# Patient Record
Sex: Female | Born: 1960
Health system: Southern US, Community
[De-identification: ages and names within clinical notes are randomized; demographics above are authoritative.]

## PROBLEM LIST (undated history)

## (undated) DIAGNOSIS — N3010 Interstitial cystitis (chronic) without hematuria: Secondary | ICD-10-CM

## (undated) DIAGNOSIS — B029 Zoster without complications: Secondary | ICD-10-CM

## (undated) DIAGNOSIS — I10 Essential (primary) hypertension: Secondary | ICD-10-CM

## (undated) DIAGNOSIS — F419 Anxiety disorder, unspecified: Secondary | ICD-10-CM

## (undated) DIAGNOSIS — M1990 Unspecified osteoarthritis, unspecified site: Secondary | ICD-10-CM

## (undated) DIAGNOSIS — K219 Gastro-esophageal reflux disease without esophagitis: Secondary | ICD-10-CM

## (undated) HISTORY — PX: TONSILLECTOMY: SUR1361

## (undated) HISTORY — DX: Unspecified osteoarthritis, unspecified site: M19.90

## (undated) HISTORY — DX: Zoster without complications: B02.9

## (undated) HISTORY — PX: HYSTEROSCOPY: SHX211

## (undated) HISTORY — DX: Gastro-esophageal reflux disease without esophagitis: K21.9

## (undated) HISTORY — DX: Essential (primary) hypertension: I10

## (undated) HISTORY — DX: Anxiety disorder, unspecified: F41.9

## (undated) HISTORY — DX: Interstitial cystitis (chronic) without hematuria: N30.10

## (undated) HISTORY — PX: ABDOMINAL HYSTERECTOMY: SUR658

---

## 1995-07-13 ENCOUNTER — Encounter: Payer: Self-pay | Admitting: Gastroenterology

## 1997-12-12 ENCOUNTER — Other Ambulatory Visit: Admission: RE | Admit: 1997-12-12 | Discharge: 1997-12-12 | Payer: Self-pay | Admitting: Obstetrics and Gynecology

## 1999-01-24 ENCOUNTER — Other Ambulatory Visit: Admission: RE | Admit: 1999-01-24 | Discharge: 1999-01-24 | Payer: Self-pay | Admitting: Obstetrics and Gynecology

## 2000-05-12 ENCOUNTER — Other Ambulatory Visit: Admission: RE | Admit: 2000-05-12 | Discharge: 2000-05-12 | Payer: Self-pay | Admitting: Obstetrics and Gynecology

## 2001-06-27 ENCOUNTER — Other Ambulatory Visit: Admission: RE | Admit: 2001-06-27 | Discharge: 2001-06-27 | Payer: Self-pay | Admitting: Obstetrics and Gynecology

## 2001-10-03 ENCOUNTER — Encounter (INDEPENDENT_AMBULATORY_CARE_PROVIDER_SITE_OTHER): Payer: Self-pay

## 2001-10-03 ENCOUNTER — Ambulatory Visit (HOSPITAL_COMMUNITY): Admission: RE | Admit: 2001-10-03 | Discharge: 2001-10-03 | Payer: Self-pay | Admitting: Obstetrics and Gynecology

## 2002-08-08 ENCOUNTER — Other Ambulatory Visit: Admission: RE | Admit: 2002-08-08 | Discharge: 2002-08-08 | Payer: Self-pay | Admitting: Obstetrics and Gynecology

## 2003-09-19 ENCOUNTER — Other Ambulatory Visit: Admission: RE | Admit: 2003-09-19 | Discharge: 2003-09-19 | Payer: Self-pay | Admitting: Obstetrics and Gynecology

## 2004-11-13 ENCOUNTER — Other Ambulatory Visit: Admission: RE | Admit: 2004-11-13 | Discharge: 2004-11-13 | Payer: Self-pay | Admitting: Obstetrics and Gynecology

## 2005-01-07 ENCOUNTER — Encounter (INDEPENDENT_AMBULATORY_CARE_PROVIDER_SITE_OTHER): Payer: Self-pay | Admitting: Specialist

## 2005-01-07 ENCOUNTER — Ambulatory Visit (HOSPITAL_BASED_OUTPATIENT_CLINIC_OR_DEPARTMENT_OTHER): Admission: RE | Admit: 2005-01-07 | Discharge: 2005-01-07 | Payer: Self-pay | Admitting: Urology

## 2005-01-07 ENCOUNTER — Ambulatory Visit (HOSPITAL_COMMUNITY): Admission: RE | Admit: 2005-01-07 | Discharge: 2005-01-07 | Payer: Self-pay | Admitting: Urology

## 2006-12-16 ENCOUNTER — Ambulatory Visit (HOSPITAL_COMMUNITY): Admission: RE | Admit: 2006-12-16 | Discharge: 2006-12-17 | Payer: Self-pay | Admitting: Obstetrics and Gynecology

## 2006-12-16 ENCOUNTER — Encounter (INDEPENDENT_AMBULATORY_CARE_PROVIDER_SITE_OTHER): Payer: Self-pay | Admitting: Obstetrics and Gynecology

## 2007-04-21 HISTORY — PX: UPPER GASTROINTESTINAL ENDOSCOPY: SHX188

## 2007-04-21 HISTORY — PX: COLONOSCOPY: SHX174

## 2007-11-17 ENCOUNTER — Telehealth: Payer: Self-pay | Admitting: Internal Medicine

## 2007-12-19 DIAGNOSIS — K219 Gastro-esophageal reflux disease without esophagitis: Secondary | ICD-10-CM | POA: Insufficient documentation

## 2007-12-19 DIAGNOSIS — J069 Acute upper respiratory infection, unspecified: Secondary | ICD-10-CM | POA: Insufficient documentation

## 2007-12-19 DIAGNOSIS — K209 Esophagitis, unspecified without bleeding: Secondary | ICD-10-CM | POA: Insufficient documentation

## 2007-12-20 ENCOUNTER — Ambulatory Visit: Payer: Self-pay | Admitting: Gastroenterology

## 2007-12-20 DIAGNOSIS — N3010 Interstitial cystitis (chronic) without hematuria: Secondary | ICD-10-CM | POA: Insufficient documentation

## 2007-12-20 DIAGNOSIS — F411 Generalized anxiety disorder: Secondary | ICD-10-CM | POA: Insufficient documentation

## 2007-12-20 DIAGNOSIS — K589 Irritable bowel syndrome without diarrhea: Secondary | ICD-10-CM | POA: Insufficient documentation

## 2007-12-20 DIAGNOSIS — R519 Headache, unspecified: Secondary | ICD-10-CM | POA: Insufficient documentation

## 2007-12-20 DIAGNOSIS — D649 Anemia, unspecified: Secondary | ICD-10-CM | POA: Insufficient documentation

## 2007-12-20 DIAGNOSIS — R51 Headache: Secondary | ICD-10-CM | POA: Insufficient documentation

## 2007-12-20 DIAGNOSIS — K5900 Constipation, unspecified: Secondary | ICD-10-CM | POA: Insufficient documentation

## 2007-12-20 LAB — CONVERTED CEMR LAB
AST: 19 units/L (ref 0–37)
Albumin: 4.2 g/dL (ref 3.5–5.2)
Alkaline Phosphatase: 66 units/L (ref 39–117)
Basophils Absolute: 0 10*3/uL (ref 0.0–0.1)
Bilirubin, Direct: 0.1 mg/dL (ref 0.0–0.3)
CO2: 30 meq/L (ref 19–32)
Calcium: 8.9 mg/dL (ref 8.4–10.5)
Chloride: 110 meq/L (ref 96–112)
Glucose, Bld: 94 mg/dL (ref 70–99)
Hemoglobin: 13 g/dL (ref 12.0–15.0)
Lymphocytes Relative: 37.2 % (ref 12.0–46.0)
MCHC: 34 g/dL (ref 30.0–36.0)
Monocytes Relative: 8.8 % (ref 3.0–12.0)
Neutro Abs: 2.7 10*3/uL (ref 1.4–7.7)
Neutrophils Relative %: 52.5 % (ref 43.0–77.0)
Platelets: 213 10*3/uL (ref 150–400)
Potassium: 4 meq/L (ref 3.5–5.1)
RDW: 12 % (ref 11.5–14.6)
Sodium: 143 meq/L (ref 135–145)
Total Bilirubin: 0.7 mg/dL (ref 0.3–1.2)
Total Protein: 7.5 g/dL (ref 6.0–8.3)

## 2008-01-02 ENCOUNTER — Encounter: Payer: Self-pay | Admitting: Gastroenterology

## 2008-01-02 ENCOUNTER — Ambulatory Visit: Payer: Self-pay | Admitting: Gastroenterology

## 2008-01-05 ENCOUNTER — Encounter: Payer: Self-pay | Admitting: Gastroenterology

## 2009-05-21 ENCOUNTER — Ambulatory Visit: Payer: Self-pay | Admitting: Gastroenterology

## 2009-05-21 DIAGNOSIS — R1013 Epigastric pain: Secondary | ICD-10-CM | POA: Insufficient documentation

## 2009-05-23 ENCOUNTER — Ambulatory Visit (HOSPITAL_COMMUNITY): Admission: RE | Admit: 2009-05-23 | Discharge: 2009-05-23 | Payer: Self-pay | Admitting: Gastroenterology

## 2009-05-23 LAB — CONVERTED CEMR LAB
ALT: 15 units/L (ref 0–35)
AST: 20 units/L (ref 0–37)
CO2: 30 meq/L (ref 19–32)
Creatinine, Ser: 0.6 mg/dL (ref 0.4–1.2)
Ferritin: 34.1 ng/mL (ref 10.0–291.0)
Folate: 11.6 ng/mL
GFR calc non Af Amer: 113.92 mL/min (ref >60)
Glucose, Bld: 87 mg/dL (ref 70–99)
HCT: 39.1 % (ref 36.0–46.0)
Iron: 81 ug/dL (ref 42–145)
Lymphocytes Relative: 36.8 % (ref 12.0–46.0)
Lymphs Abs: 1.6 10*3/uL (ref 0.7–4.0)
MCHC: 33.2 g/dL (ref 30.0–36.0)
MCV: 91.9 fL (ref 78.0–100.0)
Monocytes Absolute: 0.4 10*3/uL (ref 0.1–1.0)
Platelets: 189 10*3/uL (ref 150.0–400.0)
Potassium: 4.2 meq/L (ref 3.5–5.1)
RDW: 11.8 % (ref 11.5–14.6)
Sed Rate: 10 mm/hr (ref 0–22)
Sodium: 141 meq/L (ref 135–145)
TSH: 0.79 microintl units/mL (ref 0.35–5.50)
Total Protein: 7.5 g/dL (ref 6.0–8.3)
Vitamin B-12: 290 pg/mL (ref 211–911)
WBC: 4.3 10*3/uL — ABNORMAL LOW (ref 4.5–10.5)

## 2009-05-24 ENCOUNTER — Telehealth: Payer: Self-pay | Admitting: Gastroenterology

## 2010-03-04 ENCOUNTER — Encounter (INDEPENDENT_AMBULATORY_CARE_PROVIDER_SITE_OTHER): Payer: Self-pay | Admitting: *Deleted

## 2010-03-04 ENCOUNTER — Ambulatory Visit: Payer: Self-pay | Admitting: Gastroenterology

## 2010-03-04 DIAGNOSIS — R079 Chest pain, unspecified: Secondary | ICD-10-CM | POA: Insufficient documentation

## 2010-04-28 ENCOUNTER — Encounter: Payer: Self-pay | Admitting: Gastroenterology

## 2010-04-28 ENCOUNTER — Ambulatory Visit
Admission: RE | Admit: 2010-04-28 | Discharge: 2010-04-28 | Payer: Self-pay | Source: Home / Self Care | Attending: Gastroenterology | Admitting: Gastroenterology

## 2010-05-05 ENCOUNTER — Encounter: Payer: Self-pay | Admitting: Gastroenterology

## 2010-05-20 NOTE — Assessment & Plan Note (Signed)
Summary: HEARTBURN/YF   History of Present Illness Visit Type: Follow-up Visit Primary GI MD: Sheryn Bison MD FACP FAGA Primary Provider: Gweneth Dimitri, MD Requesting Provider: n/a Chief Complaint: Heartburn, discomfort in esophagus and generilized abdominal pain, has more of an issue after eating chocolate History of Present Illness:   extremity pleasant 50 year old Caucasian female who we have treated for acid reflux for several years. She currently is on omeprazole 20 mg b.i.d. For the last several much she's had increased coughing, choking, and also a chronic globus type sensation in her retropharyngeal area with some painful swallowing. We reviewed her endoscopy from 2 years ago which otherwise was noncontributory. She does not abuse alcohol, cigarettes, or NSAIDs. Her appetite is good her weight is stable. She has not been on frequent antibiotics, she does have a history of allergies and hayfever. Because of her complaints she currently is on Zyrtec.  She has had recent upper abdominal ultrasound exam by primary care, this was reviewed and appears unremarkable.   GI Review of Systems    Reports abdominal pain and  heartburn.     Location of  Abdominal pain: generalized.    Denies acid reflux, belching, bloating, chest pain, dysphagia with liquids, dysphagia with solids, loss of appetite, nausea, vomiting, vomiting blood, weight loss, and  weight gain.        Denies anal fissure, black tarry stools, change in bowel habit, constipation, diarrhea, diverticulosis, fecal incontinence, heme positive stool, hemorrhoids, irritable bowel syndrome, jaundice, light color stool, liver problems, rectal bleeding, and  rectal pain.    Current Medications (verified): 1)  Vivelle-Dot 0.075 Mg/24hr Pttw (Estradiol) .... As Directed 2)  Citalopram Hydrobromide 20 Mg Tabs (Citalopram Hydrobromide) .... Take 1 Tablet By Mouth Once Daily 3)  Prilosec 20 Mg Cpdr (Omeprazole) .Marland Kitchen.. 1 By Mouth Two Times A  Day  Allergies (verified): 1)  ! Septra 2)  ! Erythromycin 3)  ! * Nitrous Oxide  Past History:  Family History: Last updated: 05/21/2009 Family History of Prostate Cancer:Father Leukemia: MGM ETOH abuse: grandfather Family History of Breast Cancer:Aunts Family History of Crohn's: Mother Family History of Irritable Bowel Syndrome:Mother   No FH of Colon Cancer:  Social History: Last updated: 05/21/2009 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 Patient is a former smoker. -stopped at age 43 Daily Caffeine Use -1/2 cup daily  Past medical, surgical, family and social histories (including risk factors) reviewed for relevance to current acute and chronic problems.  Past Medical History: Reviewed history from 12/20/2007 and no changes required. Current Problems:  URI (ICD-465.9) GERD (ICD-530.81) ESOPHAGITIS (ICD-530.10) Current Problems:  IRRITABLE BOWEL SYNDROME (ICD-564.1) INTERSTITIAL CYSTITIS (ICD-595.1) HEADACHE, CHRONIC (ICD-784.0) ANXIETY DISORDER (ICD-300.00) ANEMIA (ICD-285.9) URI (ICD-465.9) GERD (ICD-530.81) ESOPHAGITIS (ICD-530.10)  Past Surgical History: Reviewed history from 05/21/2009 and no changes required. Tonsillectomy Hysterectomy And bilateral oophorectomy for endometriosis. The surgery is performed by Dr. Arelia Sneddon proximally year ago.  Family History: Reviewed history from 05/21/2009 and no changes required. Family History of Prostate Cancer:Father Leukemia: MGM ETOH abuse: grandfather Family History of Breast Cancer:Aunts Family History of Crohn's: Mother Family History of Irritable Bowel Syndrome:Mother   No FH of Colon Cancer:  Social History: Reviewed history from 05/21/2009 and no changes required. 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 Patient is a former smoker. -stopped at age 8 Daily Caffeine Use -1/2 cup  daily  Review of Systems  The patient complains of allergy/sinus and cough.  The patient denies anemia, anxiety-new, arthritis/joint pain, back pain, blood in urine, breast changes/lumps, change in vision, confusion, coughing up blood, depression-new, fainting, fatigue, fever, headaches-new, hearing problems, heart murmur, heart rhythm changes, itching, menstrual pain, muscle pains/cramps, night sweats, nosebleeds, pregnancy symptoms, shortness of breath, skin rash, sleeping problems, sore throat, swelling of feet/legs, swollen lymph glands, thirst - excessive , urination - excessive , urination changes/pain, urine leakage, vision changes, and voice change.   ENT:  Complains of nasal congestion, hoarseness, and difficulty swallowing; denies earache, ear discharge, tinnitus, decreased hearing, loss of smell, nosebleeds, and sore throat. Resp:  Complains of cough; denies dyspnea at rest, dyspnea with exercise, sputum, wheezing, coughing up blood, and pleurisy. GI:  Complains of indigestion/heartburn; denies difficulty swallowing, pain on swallowing, nausea, vomiting, vomiting blood, abdominal pain, jaundice, gas/bloating, diarrhea, constipation, change in bowel habits, bloody BM's, black BMs, and fecal incontinence.  Vital Signs:  Patient profile:   50 year old female Height:      67 inches Weight:      140 pounds BMI:     22.01 BSA:     1.74 Pulse rate:   80 / minute Pulse rhythm:   regular BP sitting:   122 / 78  (left arm)  Vitals Entered By: Merri Ray CMA Duncan Dull) (March 04, 2010 9:53 AM)  Physical Exam  General:  Well developed, well nourished, no acute distress.healthy appearing.   Head:  Normocephalic and atraumatic. Eyes:  PERRLA, no icterus.exam deferred to patient's ophthalmologist.   Mouth:  No deformity or lesions, dentition normal. Neck:  Supple; no masses or thyromegaly. Lungs:  Clear throughout to auscultation. Heart:  Regular rate and rhythm; no murmurs, rubs,   or bruits. Abdomen:  Soft, nontender and nondistended. No masses, hepatosplenomegaly or hernias noted. Normal bowel sounds. Msk:  Symmetrical with no gross deformities. Normal posture. Extremities:  No clubbing, cyanosis, edema or deformities noted. Neurologic:  Alert and  oriented x4;  grossly normal neurologically. Cervical Nodes:  No significant cervical adenopathy. Psych:  Alert and cooperative. Normal mood and affect.   Impression & Recommendations:  Problem # 1:  CHEST PAIN (ICD-786.50) Assessment Deteriorated Previous endoscopy did not show a large hiatal hernia or any significant esophagitis. Her current symptoms sound like she may have an" inlet pouch" in her upper esophagus versus eosinophilic esophagitis. I will repeat her endoscopy with esophageal biopsies and consider them. Dilation. Until her endoscopy had been completed we will continue her b.i.d. omeprazole with p.r.n. Carafate suspension. Orders: EGD (EGD)  Problem # 2:  IRRITABLE BOWEL SYNDROME (ICD-564.1) Assessment: Improved high-fiber diet as tolerated.  Problem # 3:  INTERSTITIAL CYSTITIS (ICD-595.1) Assessment: Comment Only  Problem # 4:  ANXIETY DISORDER (ICD-300.00) Assessment: Improved she is on Citalopram 20 mg a day her primary care.  Patient Instructions: 1)  Copy sent to : Gweneth Dimitri, MD 2)  Your prescription(s) have been sent to you pharmacy.  3)  Your procedure has been scheduled for 03/31/2010, please follow the seperate instructions.  4)  [facility] Patient Information Guide given to patient.  5)  Upper Endoscopy brochure given.  6)  The medication list was reviewed and reconciled.  All changed / newly prescribed medications were explained.  A complete medication list was provided to the patient / caregiver. 7)  Avoid foods high in acid content ( tomatoes, citrus juices, spicy foods) . Avoid eating within 3 to 4 hours of lying down or before exercising.  Do not over eat; try smaller  more frequent meals. Elevate head of bed four inches when sleeping.  Prescriptions: CARAFATE 1 GM/10ML SUSP (SUCRALFATE) take 10 CC by mouth as needed  #1200 ml x 0   Entered by:   Harlow Mares CMA (AAMA)   Authorized by:   Mardella Layman MD [facility]   Signed by:   Harlow Mares CMA (AAMA) on 03/04/2010   Method used:   Electronically to        [facility] [address] #[id number]* (retail)       [address]       [place], [place]  [address]       Ph: [phone]       Fax: [phone]   RxID:   [id number]

## 2010-05-20 NOTE — Assessment & Plan Note (Signed)
Summary: IBS FLARE UP/YF   History of Present Illness Visit Type: Follow-up Visit Primary GI MD: Sheryn Bison MD FACP FAGA Primary Provider: Gweneth Dimitri, MD Chief Complaint: Patient here to discuss "IBS Flare" She c/o epigastric pain and some lower abdominal discomfort. She denies any change in bowels or any appearance of blood in the stool.  Patient also denies any upper GI problems. History of Present Illness:   This patient is a 50 year old Caucasian female with chronic IBS who has had a week of discomfort in her epigastric area radiating into her back without other precipitating or alleviating elements. There has been some bloating and distention but no specific hepatobiliary or lower GI complaints. She denies abusive NSAIDs or alcohol. She is on  Celexa, omeprazole 20 mg a day, and estrogen patch. Previous endoscopy and colonoscopy have been unremarkable. Family history is unremarkable for any gastrointestinal problems   GI Review of Systems    Reports abdominal pain.     Location of  Abdominal pain: epigastric area/lower.    Denies acid reflux, belching, bloating, chest pain, dysphagia with liquids, dysphagia with solids, heartburn, loss of appetite, nausea, vomiting, vomiting blood, weight loss, and  weight gain.        Denies anal fissure, black tarry stools, change in bowel habit, constipation, diarrhea, diverticulosis, fecal incontinence, heme positive stool, hemorrhoids, irritable bowel syndrome, jaundice, light color stool, liver problems, rectal bleeding, and  rectal pain.    Current Medications (verified): 1)  Vivelle-Dot 0.075 Mg/24hr Pttw (Estradiol) .... As Directed 2)  Citalopram Hydrobromide 20 Mg Tabs (Citalopram Hydrobromide) .... Take 1 Tablet By Mouth Once Daily 3)  Omeprazole 20 Mg Cpdr (Omeprazole) .... At Bedtime  Allergies: 1)  ! Septra 2)  ! Erythromycin 3)  ! * Nitrous Oxide  Past History:  Past medical, surgical, family and social histories  (including risk factors) reviewed for relevance to current acute and chronic problems.  Past Medical History: Reviewed history from 12/20/2007 and no changes required. Current Problems:  URI (ICD-465.9) GERD (ICD-530.81) ESOPHAGITIS (ICD-530.10) Current Problems:  IRRITABLE BOWEL SYNDROME (ICD-564.1) INTERSTITIAL CYSTITIS (ICD-595.1) HEADACHE, CHRONIC (ICD-784.0) ANXIETY DISORDER (ICD-300.00) ANEMIA (ICD-285.9) URI (ICD-465.9) GERD (ICD-530.81) ESOPHAGITIS (ICD-530.10)  Past Surgical History: Tonsillectomy Hysterectomy And bilateral oophorectomy for endometriosis. The surgery is performed by Dr. Arelia Sneddon proximally year ago.  Family History: Reviewed history from 12/20/2007 and no changes required. Family History of Prostate Cancer:Father Leukemia: MGM ETOH abuse: grandfather Family History of Breast Cancer:Aunts Family History of Crohn's: Mother Family History of Irritable Bowel Syndrome:Mother   No FH of Colon Cancer:  Social History: Reviewed history from 12/20/2007 and no changes required. 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 Patient is a former smoker. -stopped at age 89 Daily Caffeine Use -1/2 cup daily Smoking Status:  quit  Review of Systems       The patient complains of back pain and fatigue.    Vital Signs:  Patient profile:   50 year old female Height:      67 inches Weight:      136.13 pounds BMI:     21.40 BSA:     1.72 Pulse rate:   80 / minute Pulse rhythm:   regular BP sitting:   122 / 78  (right arm)  Vitals Entered By: Hortense Ramal CMA Duncan Dull) (May 21, 2009 12:00 PM)  Physical Exam  General:  Well developed, well nourished, no acute distress.healthy appearing.   Head:  Normocephalic  and atraumatic. Eyes:  PERRLA, no icterus.exam deferred to patient's ophthalmologist.   Lungs:  Clear throughout to auscultation. Heart:  Regular rate and rhythm; no murmurs, rubs,  or  bruits. Abdomen:  Soft, nontender and nondistended. No masses, hepatosplenomegaly or hernias noted. Normal bowel sounds. Msk:  Symmetrical with no gross deformities. Normal posture. Extremities:  No clubbing, cyanosis, edema or deformities noted. Neurologic:  Alert and  oriented x4;  grossly normal neurologically. Psych:  Alert and cooperative. Normal mood and affect.   Impression & Recommendations:  Problem # 1:  ABDOMINAL PAIN, EPIGASTRIC (ICD-789.06) Assessment Improved Rule Out cholelithiasis versus biliary dyskinesia. Labs and ultrasound exam has been ordered with possible need for future CCK-HIDA scan in the interim she is to use p.r.n. sublingual Levsin. Orders: TLB-CBC Platelet - w/Differential (85025-CBCD) TLB-BMP (Basic Metabolic Panel-BMET) (80048-METABOL) TLB-Hepatic/Liver Function Pnl (80076-HEPATIC) TLB-TSH (Thyroid Stimulating Hormone) (84443-TSH) TLB-B12, Serum-Total ONLY (33295-J88) TLB-Ferritin (82728-FER) TLB-Folic Acid (Folate) (82746-FOL) TLB-IBC Pnl (Iron/FE;Transferrin) (83550-IBC) TLB-Amylase (82150-AMYL) TLB-Lipase (83690-LIPASE) TLB-Sedimentation Rate (ESR) (85652-ESR) Ultrasound Abdomen (UAS)  Problem # 2:  GERD (ICD-530.81) Assessment: Improved Continue chronic reflux regime and daily PPI therapy.  Patient Instructions: 1)  Copy sent to : Dr. Gweneth Dimitri 2)  Please continue current medications. 3)  Labs an upper abdominal ultrasound exam scheduled 4)  P.r.n. sublingual Levsin  5)  Avoid foods high in acid content ( tomatoes, citrus juices, spicy foods) . Avoid eating within 3 to 4 hours of lying down or before exercising. Do not over eat; try smaller more frequent meals. Elevate head of bed four inches when sleeping.  6)  The medication list was reviewed and reconciled.  All changed / newly prescribed medications were explained.  A complete medication list was provided to the patient / caregiver. Prescriptions: LEVSIN/SL 0.125 MG  SUBL (HYOSCYAMINE  SULFATE) 1 sl q 4-6 hrs as needed  #40 x 3   Entered by:   Ashok Cordia RN   Authorized by:   Mardella Layman MD [facility]   Signed by:   Ashok Cordia RN on 05/21/2009   Method used:   Electronically to        [facility] #[id number]* (retail)       [address]       [place], [place]  [address]       Ph: [phone]       Fax: [phone]   RxID:   [id number]

## 2010-05-20 NOTE — Progress Notes (Signed)
Summary: Triage  Phone Note Call from Patient Call back at [phone]   Caller: Patient Call For: Dr. Jarold Motto Reason for Call: Talk to Nurse Summary of Call: Pt is calling about the Xifaxan medication. Wants to know if there is another alternative. She cannot afford it. It is going to be $200. Initial call taken by: Karna Christmas,  May 24, 2009 8:47 AM  Follow-up for Phone Call        metronidazole 250mg  three times a day for 2 weeks...no alcohol... Follow-up by: Mardella Layman MD Clementeen Graham,  May 24, 2009 10:40 AM  Additional Follow-up for Phone Call Additional follow up Details #1::        Pt notified. Additional Follow-up by: Ashok Cordia RN,  May 24, 2009 12:16 PM    New/Updated Medications: METRONIDAZOLE 250 MG  TABS (METRONIDAZOLE) Take 1 three times a day for 14 days Prescriptions: METRONIDAZOLE 250 MG  TABS (METRONIDAZOLE) Take 1 three times a day for 14 days  #42 x 0   Entered by:   Ashok Cordia RN   Authorized by:   Mardella Layman MD [facility]   Signed by:   Ashok Cordia RN on 05/24/2009   Method used:   Electronically to        [facility] #[id number]* (retail)       [address]       [place], [place]  [address]       Ph: [phone]       Fax: [phone]   RxID:   [id number]

## 2010-05-20 NOTE — Letter (Signed)
Summary: EGD Instructions  [facility]  [address] [place], [place] [address]   Phone: [phone]  Fax: [phone]       Kristin Ramsey    [birth year]    MRN: [id number]       Procedure Day /Date: 03/31/2010 Monday     Arrival Time: 1:00pm     Procedure Time: 2:00pm     Location of Procedure:                    [facility] (4th Floor)    PREPARATION FOR ENDOSCOPY   On 03/31/2010 THE DAY OF THE PROCEDURE:  1.   No solid foods, milk or milk products are allowed after midnight the night before your procedure.  2.   Do not drink anything colored red or purple.  Avoid juices with pulp.  No orange juice.  3.  You may drink clear liquids until 12:00pm, which is 2 hours before your procedure.                                                                                                CLEAR LIQUIDS INCLUDE: Water Jello Ice Popsicles Tea (sugar ok, no milk/cream) Powdered fruit flavored drinks Coffee (sugar ok, no milk/cream) Gatorade Juice: apple, white grape, white cranberry  Lemonade Clear bullion, consomm, broth Carbonated beverages (any kind) Strained chicken noodle soup Hard Candy   MEDICATION INSTRUCTIONS  Unless otherwise instructed, you should take regular prescription medications with a small sip of water as early as possible the morning of your procedure.                OTHER INSTRUCTIONS  You will need a responsible adult at least 48 years of age to accompany you and drive you home.   This person must remain in the waiting room during your procedure.  Wear loose fitting clothing that is easily removed.  Leave jewelry and other valuables at home.  However, you may wish to bring a book to read or an iPod/MP3 player to listen to music as you wait for your procedure to start.  Remove all body piercing jewelry and leave at home.  Total time from sign-in until discharge is approximately 2-3 hours.  You should go  home directly after your procedure and rest.  You can resume normal activities the day after your procedure.  The day of your procedure you should not:   Drive   Make legal decisions   Operate machinery   Drink alcohol   Return to work  You will receive specific instructions about eating, activities and medications before you leave.    The above instructions have been reviewed and explained to me by   _______________________    I fully understand and can verbalize these instructions _____________________________ Date _________

## 2010-05-22 NOTE — Procedures (Signed)
Summary: Upper Endoscopy  Patient: Kristin Ramsey Note: All result statuses are Final unless otherwise noted.  Tests: (1) Upper Endoscopy (EGD)   EGD Upper Endoscopy       DONE     [facility]     [address]     [place], [place]  [address]           ENDOSCOPY PROCEDURE REPORT           PATIENT:  Kristin Ramsey, Kristin Ramsey  MR#:  [id number]     BIRTHDATE:  02/26/1961, 49 yrs. old  GENDER:  female           ENDOSCOPIST:  Vania Rea. Jarold Motto, MD, [facility]     Referred by:  Gweneth Dimitri, M.D.           PROCEDURE DATE:  04/28/2010     PROCEDURE:  EGD with biopsy, [address]     ASA CLASS:  Class I     INDICATIONS:  GERD, globus           MEDICATIONS:   Fentanyl 50 mcg IV, Versed 7 mg IV     TOPICAL ANESTHETIC:  Exactacain Spray           DESCRIPTION OF PROCEDURE:   After the risks benefits and     alternatives of the procedure were thoroughly explained, informed     consent was obtained.  The [facility] [id number] endoscope was     introduced through the mouth and advanced to the second portion of     the duodenum, without limitations.  The instrument was slowly     withdrawn as the mucosa was fully examined.     <<PROCEDUREIMAGES>>           There were multiple polyps identified. in the fundus. 3-5MM FUNDAL     POLYPS BIOPSIED.#1.  The esophagus and gastroesophageal junction     were completely normal in appearance. BIOPSIES DONE.ALSO NOTED IS     AN "INLET POUCH" IN UPPER ESOPHAGUS.  Normal duodenal folds were     noted.    Retroflexed views revealed a hiatal hernia.  3-4 CM HH     NOTED.  The scope was then withdrawn from the patient and the     procedure completed.           COMPLICATIONS:  None           ENDOSCOPIC IMPRESSION:     1) Polyps, multiple in the fundus     2) Normal esophagus     3) Normal duodenal folds     4) A hiatal hernia     CHRONIC GERD.R/O EOSINOPHILIC ESOPHAGITIS.INLET POUCH OF     HETEROTROPHIC GASTRIC MUCOSA IN UPPER ESOPHAGUS.     RECOMMENDATIONS:      1) Await pathology results     2) continue PPI           REPEAT EXAM:  No           ______________________________     Vania Rea. Jarold Motto, MD, Clementeen Graham           CC:           n.     eSIGNED:   Vania Rea. Vercie Pokorny at 04/28/2010 03:40 PM           Ralene Cork, [id number]  Note: An exclamation mark (!) indicates a result that was not dispersed into the flowsheet. Document Creation Date: 04/28/2010 3:40 PM _______________________________________________________________________  (1) Order result status:  Final Collection or observation date-time: 04/28/2010 15:24 Requested date-time:  Receipt date-time:  Reported date-time:  Referring Physician:   Ordering Physician: Sheryn Bison [id number]) Specimen Source:  Source: Launa Grill Order Number: [id number] Lab site:

## 2010-05-22 NOTE — Letter (Signed)
Summary: Patient [facility] Biopsy Results  [facility]  [address] [place], [place] [address]   Phone: [phone]  Fax: [phone]        May 05, 2010 MRN: [id number]    SHERNELL SALDIERNA [address] [place], [place]  [address]    Dear Ms. Yeske,  I am pleased to inform you that the biopsies taken during your recent endoscopic examination did not show any evidence of cancer upon pathologic examination.  Additional information/recommendations:  __No further action is needed at this time.  Please follow-up with      your primary care physician for your other healthcare needs.  __ Please call [phone] to schedule a return visit to review      your condition.  _X_ Continue with the treatment plan as outlined on the day of your      exam.BIOPSIES DID NOT SHOW CHANGES OTHER THAN ACID REFLUX,,,DRP __ You should have a repeat endoscopic examination for this problem              in _ months/years.   Please call us if you are having persistent problems or have questions about your condition that have not been fully answered at this time.  Sincerely,  Mardella Layman MD [facility]  This letter has been electronically signed by your physician.  Appended Document: Patient Notice-Endo Biopsy Results Letter mailed

## 2010-09-05 NOTE — H&P (Signed)
Kristin Ramsey, MAUNEY              ACCOUNT NO.:  [id number]   MEDICAL RECORD NO.:  [id number]          PATIENT TYPE:  OIB   LOCATION:  9314                          FACILITY:  [facility]   PHYSICIAN:  Juluis Mire, M.D.   DATE OF BIRTH:  [birth year]   DATE OF ADMISSION:  12/16/2006  DATE OF DISCHARGE:                              HISTORY & PHYSICAL   HISTORY OF PRESENT ILLNESS:  The patient is a 50 year old, G1, P78,  married female who presents for laparoscopic-assisted vaginal  hysterectomy with possible bilateral salpingo-oophorectomy.  The  patient's cycles have been an ongoing problem as well as pain.  She has  problems with pain with intercourse particularly during deep  penetration.  She is also continuing to have problems with abnormal  uterine bleeding despite being on birth control pills.  We did an  ultrasound evaluation.  She had a 3 cm, simple cyst of the right ovary  with a small amount of free fluid and finally, she did have signs  suggestive of adenomyosis.  She is being actively managed for  interstitial cystitis at the present time.  We had discussed with her  various options.  Obviously, the periods are not responding to the  pills.  She continues to have heavy bleeding that has been limiting from  the patient's standpoint.  We therefore discussed other alternatives  including ablation versus hysterectomy.  The patient now presents for  hysterectomy.  It is of note, that she had a previous diagnostic  laparoscopy as well as hysteroscopy back in 2003.  We did find some  pelvic endometriosis and adhesions at that point in time..   ALLERGIES:  ERYTHROMYCIN AND SEPTRA.   MEDICATIONS:  Zoloft.   PAST MEDICAL HISTORY:  Usual childhood diseases.   PAST SURGICAL HISTORY:  1. Laparoscopy and hysteroscopy as noted.  We did find endometriosis      and pelvic adhesions at that point.  2. Tonsillectomy.  3. One previous cesarean section.   FAMILY HISTORY:  There is a family  history of breast cancer, otherwise  unremarkable.   SOCIAL HISTORY:  Occasional alcohol, no tobacco use.   REVIEW OF SYSTEMS:  Noncontributory.   PHYSICAL EXAMINATION:  VITAL SIGNS:  Afebrile, stable vital signs.  HEENT:  Normocephalic.  Pupils equal, round and reactive to light and  accommodation.  Extraocular intact.  Sclerae and conjunctivae are clear.  Oropharynx clear.  NECK:  Without thyromegaly.  BREASTS:  No discrete masses.  LUNGS:  Clear.  CARDIAC:  Regular rate, no murmurs or gallops.  ABDOMEN:  Benign.  No masses, organomegaly or tenderness.  PELVIC:  Normal external genitalia.  Vaginal cuff is clear.  Cervix  unremarkable.  Uterus upper limits of normal size, moderately boggy and  tender.  Adnexa unremarkable.  Rectovaginal exam is clear.  EXTREMITIES:  Trace edema.  NEUROLOGIC:  Gross normal limits.   IMPRESSION:  1. Menorrhagia and continued pelvic pain, possibly secondary to      adenomyosis unresponsive to conservative management.  2. Interstitial cystitis.   PLAN:  At the present time, we will proceed with laparoscopic-assisted  vaginal hysterectomy.  The patient is going to decide whether to have  the ovaries removed or not.  We have discussed that if the ovaries are  removed, there may be vasomotor symptomatology that could lead to a need  for estrogen replacement therapy.  Its associated risks were discussed  as outlined in the [facility].  If ovaries are left in  place, obviously pain can continue to be an issue as well as potential  for ovarian cancer.  The overall risks of surgery explained including  the risk of infection, risk of hemorrhage that could require transfusion  with the risk of AIDS or hepatitis, the risk of injury to adjacent  organs including bladder, bowel or ureters could require further  exploratory surgery with the risk of deep venous thrombosis and  pulmonary embolus.  With interstitial cystitis, continued pelvic pain   could be an issue that the surgery will not help, obviously it will help  the bleeding pattern.  Again, alternatives have been discussed.  The  patient stressed understanding of all the above and alternatives and  wished to proceed with hysterectomy.      Juluis Mire, M.D.  Electronically Signed     JSM/MEDQ  D:  12/16/2006  T:  12/17/2006  Job:  [id number]

## 2010-09-05 NOTE — Op Note (Signed)
Kristin Ramsey, Kristin Ramsey              ACCOUNT NO.:  [id number]   MEDICAL RECORD NO.:  [id number]          PATIENT TYPE:  AMB   LOCATION:  [facility]                         FACILITY:  [facility]   PHYSICIAN:  Ronald L. Earlene Plater, M.D.  DATE OF BIRTH:  Jul 28, 1960   DATE OF PROCEDURE:  01/07/2005  DATE OF DISCHARGE:                                 OPERATIVE REPORT   DIAGNOSIS:  Questionable interstitial cystitis.   OPERATIVE PROCEDURE:  Cystourethroscopy, hydraulic bladder distention, and  blabber biopsy.   SURGEON:  Lucrezia Starch. Earlene Plater, M.D.   ANESTHESIA:  LMA.   ESTIMATED BLOOD LOSS:  Negligible.   TUBES:  None.   COMPLICATIONS:  None.   FINDINGS:  A 600 cc bladder capacity at 80 cm of water pressure with cracks  and glomerulations on relief of the pressure.   INDICATIONS FOR PROCEDURE:  [name] is a lovely 50 year old white female  who presents with progressive frequency of urination.  She has had periodic  urinary tract infections in the past but has developed some mild right back  pain.  She has nocturia x3-5, urgency, and has pain and urgency during  intercourse.  She has rare decrease in force of stream, rare stress  incontinence, and again, nocturia.  She also has irritable bowel syndrome,  occasionally gets panic disorder, and takes Zoloft for that.   PROCEDURE IN DETAIL:  The patient was placed in a supine position.  After  proper general anesthesia, was placed in the dorsal lithotomy position,  prepped and draped with Betadine in a sterile fashion.  A cystourethroscopy  was performed with a 22.5 [place] Olympus pan endoscope.  Utilizing the 12  and 70 degree lenses, the bladder was carefully inspected.  Efflux of clear  urine was noted from the normally placed ureteral orifices bilaterally.  There were no lesions to the bladder or the urethra.  She was then slowly  hydraulically distended to 80 cm of water pressure, total capacity with 700  cc of urine.  There were cracks and diffuse  glomerulations noted, and bloody  urine was noted after this.  Reinspection revealed it was quite diffuse, and  photographs were obtained endoscopically.  A  biopsy was taken from the posterior midline with cold cup biopsy forceps,  where significant glomerulations were noted.  Submitted to pathology.  The  base was cauterized with Bugby coagulation cautery.  The bladder was  drained.  The endoscope was removed.  The patient was taken to the recovery  room stable.      Ronald L. Earlene Plater, M.D.  Electronically Signed     RLD/MEDQ  D:  01/07/2005  T:  01/07/2005  Job:  [id number]

## 2010-09-05 NOTE — Op Note (Signed)
NAMEMALYIAH, FELLOWS              ACCOUNT NO.:  [id number]   MEDICAL RECORD NO.:  [id number]          PATIENT TYPE:  OIB   LOCATION:  9314                          FACILITY:  [facility]   PHYSICIAN:  Juluis Mire, M.D.   DATE OF BIRTH:  02-23-1961   DATE OF PROCEDURE:  12/16/2006  DATE OF DISCHARGE:                               OPERATIVE REPORT   PREOPERATIVE DIAGNOSIS:  Pelvic endometriosis and uterine adenomyosis.   POSTOPERATIVE DIAGNOSIS:  Pelvic endometriosis and uterine adenomyosis.   OPERATIVE PROCEDURE:  Laparoscopic assisted vaginal hysterectomy with  bilateral salpingo-oophorectomy.   SURGEON:  Juluis Mire, M.D.   ASSISTANT:  Stann Mainland. Vincente Poli, M.D.   ANESTHESIA:  General endotracheal anesthesia.   ESTIMATED BLOOD LOSS:  300 mL.   PACKS AND DRAINS:  None.   BLOOD REPLACED:  None.   COMPLICATIONS:  None.   INDICATIONS:  Noted in history and physical.   PROCEDURE:  The patient was taken to the OR and placed in the supine  position.  After a satisfactory level of general endotracheal anesthesia  was obtained, the patient was placed in the dorsal supine position using  the Allen stirrups.  The abdomen, perineum, and vagina were prepped out  with Betadine.  The bladder was emptied by in and out catheterization.  A Hulka tenaculum was put in place.  The patient was then draped in a  sterile field.  A subumbilical incision was made with the knife and  extended down to the fascia.  The fascia was entered sharply and the  incision in the fascia extended laterally.  We entered the peritoneum  with blunt finger pressure and an open laparoscopic trocar was put in  place and secured.  The laparoscope was introduced.  There was no  evidence of injury to adjacent organs.  A 5 mm trocar was then placed in  the suprapubic area direct visualization.  Visualization revealed the  uterus to be upper limits of normal size.  Tubes and ovaries were  unremarkable.  There was no  active pelvic process. The cecum was  somewhat redundant and low.  I could not see the appendix, I think it  was retrocecal.  The upper abdomen including liver and tip of the  gallbladder were clear.  There was no evidence of any other pathology in  the pelvis or the abdomen.   At this point in time, using the gyrus, we elevated first the right  ovary.  We could easily see the ureter as it passed through the pelvic  brim and down the pelvic sidewall.  In the space above this, we  cauterized incised the ovarian vasculature, freeing up the ovary.  We  then cauterized and incised the mesenteric attachments of the ovary and  tube and then cauterized and incised the right round ligament.  We then  went to the left side.  The left ovary was elevated and, again, we could  easily see the ureter coursing over the pelvic brim and down through the  pelvic sidewall. I isolated the ovarian vascular above this and, using  the gyrus, we cauterized  and incised the ovarian vasculature.  We  continued the process freeing the ovary up from its mesenteric  attachments up to the round ligament.  The left round ligament was then  cauterized and incised.  We had good freeing of the adnexa.   At this point in time, the laparoscope was removed.  The abdomen was  deflated of its carbon dioxide.  The patient's legs were repositioned.  The Hulka tenaculum was then removed.  A weighted speculum was placed in  the vaginal vault.  The cervix was grasped with a Jacob's tenaculum.  The cul-de-sac was entered sharply.  Both uterosacral ligaments were  clamped, cut, and suture ligated with 0 Vicryl.  Reflection of the  vaginal mucosa anteriorly was incised and the bladder was dissected  superiorly. Using clamp, cut and tie technique with sutures of 0 Vicryl,  the parametrium was serially separated from the sides of the uterus, the  vesicouterine space was entered, and the uterus was then flipped.  The  remaining pedicles  were clamped and cut.  The uterus, cervix and both  ovaries were passed off the operative field and sent to pathology.  The  pedicles were secured with free ties of 0 Vicryl.  A uterosacral  plication stitch of 0 Vicryl was put in place and secured. The vaginal  mucosa was reapproximated with figure-of-eights of 0 Monocryl.  The  Foley was placed to straight drain with clear urine being obtained.   The patient's legs were repositioned.  The laparoscope was reintroduced  and the abdomen was reinflated with carbon dioxide.  Visualization  revealed hemostasis at both ovarian sites and along the pelvic sidewall.  This was oozing at the cuff brought under control with cautery using the  gyrus.  At this point, we had good hemostasis.  We deflated the abdomen.  All trocars were removed.  The subumbilical fascia was closed with a  figure-of-eight of 0 Vicryl, skin with interrupted subcuticular of 4-0  Vicryl, and the suprapubic incision was closed with Steri-Strips.  The  patient was taken out of the dorsal lithotomy position .  Once alert and  extubated, she was transferred to the recovery room in good condition.  Sponge, instrument and needle counts were reported as correct by the  circulating nurse x2.  The Foley catheter remained clear at the time of  closure.      Juluis Mire, M.D.  Electronically Signed     JSM/MEDQ  D:  12/16/2006  T:  12/17/2006  Job:  [id number]

## 2010-09-05 NOTE — H&P (Signed)
[facility]  Patient:    Kristin Ramsey, Kristin Ramsey Visit Number: [id number] MRN: [id number]          Service Type: DSU Location: DAY Attending Physician:  Frederich Balding Dictated by:   Juluis Mire, M.D. Admit Date:  10/03/2001 Discharge Date: 10/03/2001                           History and Physical  HISTORY OF PRESENT ILLNESS:  This is a 50 year old gravida 1, para 1 married white female presents for hysteroscopy, as well as diagnostic laparoscopy with laser standby.  In relation to the present admission, the patients cycles have been coming extremely heavy.  She reports 7-8 days of flow.  Four days are heavy.  During that time, she will change pads and tampons every 1-2 hours with increasing clots and discomfort.  She is also having increasing pain and discomfort associated with intercourse.  Previous evaluation has been consistent with what is felt to be endometriosis.  Hemoglobin has dropped considerably with the heavy flow.  We noticed on her last visit her hemoglobin had dropped to 7.8.  Followup on iron sulfate supplementation had risen to 8.4.  Subsequent saline infusion ultrasound did reveal two large endometrial polyps.  She also had some simple cysts.  In view of the continued heavy bleeding and the finding of polyps, she is proceeding now for a hysteroscopy, along with diagnostic laparoscopy with laser standby.  ALLERGIES: 1. ERYTHROMYCIN. 2. SEPTRA.  MEDICATIONS: 1. Zoloft. 2. Aciphex.  PAST MEDICAL HISTORY:  Usual childhood diseases without any significant sequelae.  She has had a previous tonsillectomy.  PAST OBSTETRICAL HISTORY:  One prior cesarean section.  FAMILY HISTORY:  Noncontributory.  SOCIAL HISTORY:  No tobacco or alcohol use.  REVIEW OF SYSTEMS:  Noncontributory.  PHYSICAL EXAMINATION:  VITAL SIGNS:  The patient is afebrile with stable vital signs.  HEENT:  The patient with pupils equal and reactive to light  and accommodation. Extraocular movements were intact.  Sclerae and conjunctivae clear. Oropharynx clear.  NECK:  Without thyromegaly.  BREASTS:  No discrete masses.  LUNGS:  Clear.  CARDIAC:  Regular rhythm and rate without murmurs or gallops.  ABDOMEN:  Benign.  No mass, organomegaly, or tenderness.  PELVIC:  Normal external genitalia.  Vaginal cuff is clear.  Cervix is unremarkable.  Uterus is posterior, upper limits of normal size.  Adnexa unremarkable.  EXTREMITIES:  Trace edema.  NEUROLOGIC:  Grossly within normal limits.  IMPRESSION: 1. Menorrhagia with associated anemia secondary to endometrial polyp. 2. Pelvic pain and dyspareunia, possible endometriosis.  PLAN:  The patient will undergo the above-mentioned surgery.  Risks of surgery have been discussed including the risk of anesthesia, the risk of infection, the risk of hemorrhage from vascular injury that could require transfusion with the risk of acquired immune deficiency syndrome or hepatitis.  Injury to adjacent organs such as vessels, bladder, or bowel could require further exploratory surgery.  There is also the risk of deep venous thrombosis and pulmonary embolus.  The patient expressed understanding of indications and risks. Dictated by:   Juluis Mire, M.D. Attending Physician:  Frederich Balding DD:  10/03/01 TD:  10/04/01 Job: [id number]

## 2010-09-05 NOTE — Discharge Summary (Signed)
Kristin Ramsey, Kristin Ramsey              ACCOUNT NO.:  [id number]   MEDICAL RECORD NO.:  [id number]          PATIENT TYPE:  OIB   LOCATION:  9314                          FACILITY:  [facility]   PHYSICIAN:  Juluis Mire, M.D.   DATE OF BIRTH:  1960-06-18   DATE OF ADMISSION:  12/16/2006  DATE OF DISCHARGE:  12/17/2006                               DISCHARGE SUMMARY   ADMITTING DIAGNOSIS:  Endometriosis and uterine adenomyosis.   DISCHARGE DIAGNOSIS:  Endometriosis and uterine adenomyosis.   OPERATIVE PROCEDURE:  Laparoscopic-assisted vaginal hysterectomy with  bilateral salpingo-oophorectomy.   For complete history and physical, please see dictated note.   COURSE IN THE HOSPITAL:  The patient admitted, underwent above-noted  surgery.  Pathology is pending.  Postoperative hemoglobin 11.1.  Discharged home on her first postoperative day.  At that time she was  afebrile with stable vital signs.  She was tolerating her diet.  She had  voided without difficulty and was ambulating.  All incisions were  intact.  Bowel sounds were active.   COMPLICATIONS:  None were incurred during her stay in the hospital.  The  patient was discharged home in stable condition.   DISPOSITION:  The patient is to avoid heavy lifting, vaginal entrance,  and be careful driving.  She is to watch for signs of infection, nausea,  vomiting, increasing abdominal pain, or active vaginal bleeding.  Also  instructed in signs and symptoms of deep venous thrombosis or pulmonary  embolus.  Discharged home on Tylox as needed for pain, Vivelle-Dot if  needed.  Risks and benefits of hormone replacement therapy have been  discussed.  Plan followup in the office in 1 week.      Juluis Mire, M.D.  Electronically Signed     JSM/MEDQ  D:  12/17/2006  T:  12/18/2006  Job:  [id number]

## 2010-09-05 NOTE — Op Note (Signed)
[facility]  Patient:    Kristin Ramsey, Kristin Ramsey Visit Number: [id number] MRN: [id number]          Service Type: DSU Location: DAY Attending Physician:  Frederich Balding Dictated by:   Juluis Mire, M.D. Proc. Date: 10/03/01 Admit Date:  10/03/2001                             Operative Report  PREOPERATIVE DIAGNOSES: 1. Menorrhagia with endometrial polyps. 2. Pelvic pain and dyspareunia. 3. Rule out pelvic endometriosis.  POSTOPERATIVE DIAGNOSES: 1. Endometrial polyps. 2. Omental abdominal adhesions. 4. Pelvic endometriosis.  OPERATIVE PROCEDURE: 1. Hysteroscopy with resection of numerous endometrial polyps. 2. Diagnostic laparoscopy with lysis of adhesions and cautery of endometriotic    implants.  SURGEON:  Juluis Mire, M.D.  ANESTHESIA:  General endotracheal.  ESTIMATED BLOOD LOSS:  Minimal.  PACKS AND DRAINS:  None.  INTRAOPERATIVE BLOOD REPLACEMENT:  None.  COMPLICATIONS:  None.  DESCRIPTION OF PROCEDURE:  The patient taken to the OR and placed in supine position.  After an adequate level of general endotracheal anesthesia was obtained, the patient was placed in the dorsal lithotomy position using the Allen stirrups.  The abdomen, perineum, and vagina were prepped out with Betadine and draped in a sterile field for hysteroscopy.  Exam under anesthesia revealed the uterus to be posterior, upper limites of normal size, adnexa unremarkable.  Speculum was placed in the vaginal vault.  The cervix was grasped with a single-tooth tenaculum.  The uterus sounded approximately 9 cm.  The cervix was serially dilated to a size 35 Pratt dilator.  The operative hysteroscope was then reduced, and the intrauterine cavity was distended using sorbitol.  Numerous endometrial polyps were noted.  These were removed with the resectoscope and sent for pathological review.  We continued to resect it until all polyps were removed.  She also had an  endocervical polyp that was noted and also removed.  We had excellent hemostasis.  We did obtain numerous endometrial biopsies with the hysteroscope also.  All were sent for pathological review.  There was no evidence of active bleeding or perforation.  The hysteroscope was then removed.  A Hulka tenaculum was put in place.  The speculum was then removed.  The bladder was in-and-out catheterization.  The patient made ready for laparoscopy.  A subumbilical incision made with the knife.  The Veress needle was introduced into the abdominal cavity.  The abdomen was inflated with approximately four liters of carbon dioxide.  The operating laparoscope was introduced.  The laparoscope was introduced. Visualization did reveal some omental adhesions at the left periumbilical area and in the right lower quadrant.  The uterus was posterior, upper limits of normal size.  Tubes and ovaries were unremarkable.  She had one implant of endometriosis in the right cul-de-sac area below the right uterosacral ligament and along the left pelvic sidewall.  A 5 mm trocar was put in place in the suprapubic area under direct visualization.  Using cautery and scissors, the omental adhesions were taken down.  There was no evidence of injury to bowel.  The cecum was noted to be normal.  The appendix was retrocecal.  The upper abdomen, including the liver and tip of the gallbladder were unremarkable.  Using the cautery unit, we ablated the areas of endometriosis in the cul-de-sac area.  There was no active bleeding or signs of injury to adjacent organs.  The abdomen was  deflated of its carbon dioxide and all trocars removed.  The subumbilical incisions were closed with interrupted subcuticulars of 4-0 Vicryl.  The suprapubic incision was closed with Steri-Strips.  The Hulka tenaculum was then removed.  The patient taken out of the dorsal lithotomy position and once extubated, transferred to the recovery room in good  condition.  Sponge, needle, and instrument counts reported as correct by the circulating nurse x 2. Dictated by:   Juluis Mire, M.D. Attending Physician:  Frederich Balding DD:  10/03/01 TD:  10/03/01 Job: [id number]

## 2011-01-30 LAB — CBC
MCHC: 34.7
MCV: 86.2
Platelets: 264
RDW: 13.9
WBC: 7.5

## 2011-01-30 LAB — HCG, SERUM, QUALITATIVE: Preg, Serum: NEGATIVE

## 2012-06-15 ENCOUNTER — Other Ambulatory Visit: Payer: Self-pay | Admitting: Family Medicine

## 2012-06-15 DIAGNOSIS — R1013 Epigastric pain: Secondary | ICD-10-CM

## 2012-06-22 ENCOUNTER — Ambulatory Visit
Admission: RE | Admit: 2012-06-22 | Discharge: 2012-06-22 | Disposition: A | Discharge status: Home / Self Care | Payer: BC Managed Care – PPO | Source: Ambulatory Visit | Attending: Family Medicine | Admitting: Family Medicine

## 2012-06-22 DIAGNOSIS — R1013 Epigastric pain: Secondary | ICD-10-CM

## 2012-07-01 ENCOUNTER — Other Ambulatory Visit (HOSPITAL_COMMUNITY): Payer: Self-pay | Admitting: Family Medicine

## 2012-07-01 DIAGNOSIS — R109 Unspecified abdominal pain: Secondary | ICD-10-CM

## 2012-07-04 ENCOUNTER — Encounter (HOSPITAL_COMMUNITY)
Admission: RE | Admit: 2012-07-04 | Discharge: 2012-07-04 | Disposition: A | Discharge status: Home / Self Care | Payer: BC Managed Care – PPO | Source: Ambulatory Visit | Attending: Family Medicine | Admitting: Family Medicine

## 2012-07-04 DIAGNOSIS — R109 Unspecified abdominal pain: Secondary | ICD-10-CM | POA: Insufficient documentation

## 2012-07-04 MED ORDER — TECHNETIUM TC 99M MEBROFENIN IV KIT
5.5000 | PACK | Freq: Once | INTRAVENOUS | Status: AC | PRN
  Administered 2012-07-04: 6 via INTRAVENOUS

## 2012-07-11 ENCOUNTER — Other Ambulatory Visit: Payer: Self-pay | Admitting: Obstetrics and Gynecology

## 2013-12-27 ENCOUNTER — Other Ambulatory Visit: Payer: Self-pay | Admitting: Obstetrics and Gynecology

## 2013-12-29 LAB — CYTOLOGY - PAP

## 2014-09-18 ENCOUNTER — Other Ambulatory Visit: Payer: Self-pay | Admitting: Sports Medicine

## 2014-12-31 ENCOUNTER — Other Ambulatory Visit: Payer: Self-pay | Admitting: Obstetrics and Gynecology

## 2015-01-01 LAB — CYTOLOGY - PAP

## 2015-06-06 ENCOUNTER — Other Ambulatory Visit: Payer: Self-pay | Admitting: Sports Medicine

## 2015-10-16 DIAGNOSIS — R5383 Other fatigue: Secondary | ICD-10-CM | POA: Diagnosis not present

## 2015-10-16 DIAGNOSIS — I10 Essential (primary) hypertension: Secondary | ICD-10-CM | POA: Diagnosis not present

## 2015-10-25 DIAGNOSIS — Z13820 Encounter for screening for osteoporosis: Secondary | ICD-10-CM | POA: Diagnosis not present

## 2015-10-25 DIAGNOSIS — Z1231 Encounter for screening mammogram for malignant neoplasm of breast: Secondary | ICD-10-CM | POA: Diagnosis not present

## 2016-01-03 DIAGNOSIS — Z6825 Body mass index (BMI) 25.0-25.9, adult: Secondary | ICD-10-CM | POA: Diagnosis not present

## 2016-01-03 DIAGNOSIS — Z01419 Encounter for gynecological examination (general) (routine) without abnormal findings: Secondary | ICD-10-CM | POA: Diagnosis not present

## 2016-01-06 DIAGNOSIS — Z1283 Encounter for screening for malignant neoplasm of skin: Secondary | ICD-10-CM | POA: Diagnosis not present

## 2016-01-06 DIAGNOSIS — L820 Inflamed seborrheic keratosis: Secondary | ICD-10-CM | POA: Diagnosis not present

## 2016-05-31 DIAGNOSIS — J101 Influenza due to other identified influenza virus with other respiratory manifestations: Secondary | ICD-10-CM | POA: Diagnosis not present

## 2016-07-06 DIAGNOSIS — T753XXA Motion sickness, initial encounter: Secondary | ICD-10-CM | POA: Diagnosis not present

## 2016-07-06 DIAGNOSIS — R110 Nausea: Secondary | ICD-10-CM | POA: Diagnosis not present

## 2016-08-07 DIAGNOSIS — F3342 Major depressive disorder, recurrent, in full remission: Secondary | ICD-10-CM | POA: Diagnosis not present

## 2016-08-07 DIAGNOSIS — M2550 Pain in unspecified joint: Secondary | ICD-10-CM | POA: Diagnosis not present

## 2016-08-07 DIAGNOSIS — E785 Hyperlipidemia, unspecified: Secondary | ICD-10-CM | POA: Diagnosis not present

## 2016-08-07 DIAGNOSIS — I10 Essential (primary) hypertension: Secondary | ICD-10-CM | POA: Diagnosis not present

## 2016-08-19 DIAGNOSIS — Z713 Dietary counseling and surveillance: Secondary | ICD-10-CM | POA: Diagnosis not present

## 2016-09-09 DIAGNOSIS — M79672 Pain in left foot: Secondary | ICD-10-CM | POA: Diagnosis not present

## 2016-11-03 ENCOUNTER — Ambulatory Visit (INDEPENDENT_AMBULATORY_CARE_PROVIDER_SITE_OTHER): Payer: BLUE CROSS/BLUE SHIELD | Admitting: Podiatry

## 2016-11-03 ENCOUNTER — Encounter: Payer: Self-pay | Admitting: Podiatry

## 2016-11-03 DIAGNOSIS — M2012 Hallux valgus (acquired), left foot: Secondary | ICD-10-CM

## 2016-11-03 DIAGNOSIS — M7732 Calcaneal spur, left foot: Secondary | ICD-10-CM | POA: Diagnosis not present

## 2016-11-03 DIAGNOSIS — M722 Plantar fascial fibromatosis: Secondary | ICD-10-CM | POA: Diagnosis not present

## 2016-11-03 MED ORDER — MELOXICAM 7.5 MG PO TABS: 7.5000 mg | ORAL_TABLET | Freq: Every day | ORAL | 0 refills | Status: DC

## 2016-11-03 NOTE — Progress Notes (Signed)
   Subjective:    Patient ID: Kristin Ramsey, female    DOB: April 23, 1960, 56 y.o.   MRN: [id number]  HPI  [name] Presents the office today for concerns of left heel pain which is been ongoing for about 1 year and worsening over the last 6 months. She states that she has pain in the morning and she first gets up or if she is been sitting for some time and stands back up. She did discuss this with her primary care physician and x-rays were obtained which did reveal small heel spur. She has been taking ibuprofen which helps and she is also been rolling ice on her foot which helps but she does is intermittently majority thinks about it. She denies any recent injury or trauma. No numbness or tingling. The pain does not wake her up at night. She has no other concerns today. The pain however is limiting her activity and she does not do a lot of walking for fear of her heel hurting.   Review of Systems  All other systems reviewed and are negative.      Objective:   Physical Exam General: AAO x3, NAD  Dermatological: Skin is warm, dry and supple bilateral. Nails x 10 are well manicured; remaining integument appears unremarkable at this time. There are no open sores, no preulcerative lesions, no rash or signs of infection present.  Vascular: Dorsalis Pedis artery and Posterior Tibial artery pedal pulses are 2/4 bilateral with immedate capillary fill time. There is no pain with calf compression, swelling, warmth, erythema.   Neruologic: Grossly intact via light touch bilateral. Vibratory intact via tuning fork bilateral. Protective threshold with Semmes Wienstein monofilament intact to all pedal sites bilateral. Negative tinel sign  Musculoskeletal: Tenderness to palpation along the plantar medial tubercle of the calcaneus at the insertion of plantar fascia on the left foot. There is no pain along the course of the plantar fascia within the arch of the foot. Plantar fascia appears to be intact. There  is no pain with lateral compression of the calcaneus or pain with vibratory sensation. There is no pain along the course or insertion of the achilles tendon. No other areas of tenderness to bilateral lower extremities. HAV is present. No pain. Muscular strength 5/5 in all groups tested bilateral.  Gait: Unassisted, Nonantalgic.      Assessment & Plan:  56 year old female left heel pain, likely plantar fasciitis with small inferior calcaneal spurring -Treatment options discussed including all alternatives, risks, and complications -Etiology of symptoms were discussed -X-rays are reviewed that she brought them with her. Small inferior calcaneal spurs present. No evidence of acute fracture. -Patient elects to proceed with steroid injection into the left heel. Under sterile skin preparation, a total of 2.5cc of kenalog 10, 0.5% Marcaine plain, and 2% lidocaine plain were infiltrated into the symptomatic area without complication. A band-aid was applied. Patient tolerated the injection well without complication. Post-injection care with discussed with the patient. Discussed with the patient to ice the area over the next couple of days to help prevent a steroid flare.  -Plantar fascial brace was dispensed. -Prescribed mobic. Discussed side effects of the medication and directed to stop if any are to occur and call the office.  -Stretching, icing exercises daily. -Discussed shoe gear medications and orthotics -RTC 3 weeks or sooner if needed. Call any questions or concerns meantime.  Celesta Gentile, DPM

## 2016-11-03 NOTE — Patient Instructions (Signed)
It was nice to meet you today. Please let us know if you have any questions or concerns!  ----  Plantar Fasciitis Rehab Ask your health care provider which exercises are safe for you. Do exercises exactly as told by your health care provider and adjust them as directed. It is normal to feel mild stretching, pulling, tightness, or discomfort as you do these exercises, but you should stop right away if you feel sudden pain or your pain gets worse. Do not begin these exercises until told by your health care provider. Stretching and range of motion exercises These exercises warm up your muscles and joints and improve the movement and flexibility of your foot. These exercises also help to relieve pain. Exercise A: Plantar fascia stretch  1. Sit with your left / right leg crossed over your opposite knee. 2. Hold your heel with one hand with that thumb near your arch. With your other hand, hold your toes and gently pull them back toward the top of your foot. You should feel a stretch on the bottom of your toes or your foot or both. 3. Hold this stretch for__________ seconds. 4. Slowly release your toes and return to the starting position. Repeat __________ times. Complete this exercise __________ times a day. Exercise B: Gastroc, standing  1. Stand with your hands against a wall. 2. Extend your left / right leg behind you, and bend your front knee slightly. 3. Keeping your heels on the floor and keeping your back knee straight, shift your weight toward the wall without arching your back. You should feel a gentle stretch in your left / right calf. 4. Hold this position for __________ seconds. Repeat __________ times. Complete this exercise __________ times a day. Exercise C: Soleus, standing 1. Stand with your hands against a wall. 2. Extend your left / right leg behind you, and bend your front knee slightly. 3. Keeping your heels on the floor, bend your back knee and slightly shift your weight over  the back leg. You should feel a gentle stretch deep in your calf. 4. Hold this position for __________ seconds. Repeat __________ times. Complete this exercise __________ times a day. Exercise D: Gastrocsoleus, standing 1. Stand with the ball of your left / right foot on a step. The ball of your foot is on the walking surface, right under your toes. 2. Keep your other foot firmly on the same step. 3. Hold onto the wall or a railing for balance. 4. Slowly lift your other foot, allowing your body weight to press your heel down over the edge of the step. You should feel a stretch in your left / right calf. 5. Hold this position for __________ seconds. 6. Return both feet to the step. 7. Repeat this exercise with a slight bend in your left / right knee. Repeat __________ times with your left / right knee straight and __________ times with your left / right knee bent. Complete this exercise __________ times a day. Balance exercise This exercise builds your balance and strength control of your arch to help take pressure off your plantar fascia. Exercise E: Single leg stand 1. Without shoes, stand near a railing or in a doorway. You may hold onto the railing or door frame as needed. 2. Stand on your left / right foot. Keep your big toe down on the floor and try to keep your arch lifted. Do not let your foot roll inward. 3. Hold this position for __________ seconds. 4. If this exercise  is too easy, you can try it with your eyes closed or while standing on a pillow. Repeat __________ times. Complete this exercise __________ times a day. This information is not intended to replace advice given to you by your health care provider. Make sure you discuss any questions you have with your health care provider. Document Released: 04/06/2005 Document Revised: 12/10/2015 Document Reviewed: 02/18/2015 Elsevier Interactive Patient Education  2018 [employer].

## 2016-11-24 ENCOUNTER — Encounter: Payer: Self-pay | Admitting: Podiatry

## 2016-11-24 ENCOUNTER — Ambulatory Visit (INDEPENDENT_AMBULATORY_CARE_PROVIDER_SITE_OTHER): Payer: BLUE CROSS/BLUE SHIELD | Admitting: Podiatry

## 2016-11-24 DIAGNOSIS — M722 Plantar fascial fibromatosis: Secondary | ICD-10-CM

## 2016-11-24 NOTE — Progress Notes (Signed)
Subjective: Kristin Ramsey presents to the office today for follow-up evaluation of left heel pain. They state that they are doing "so much better". They have been icing, stretching, try to wear supportive shoe as much as possible. She also states the plantar fascial braces helped. She states that she has been on some walk with any pages are seen down at the gym and she feels that she is doing much better. She gets some occasional soreness bottom of the heel although much improved. No other complaints at this time. No acute changes since last appointment. They deny any systemic complaints such as fevers, chills, nausea, vomiting.  Objective: General: AAO x3, NAD  Dermatological: Skin is warm, dry and supple bilateral. Nails x 10 are well manicured; remaining integument appears unremarkable at this time. There are no open sores, no preulcerative lesions, no rash or signs of infection present.  Vascular: Dorsalis Pedis artery and Posterior Tibial artery pedal pulses are 2/4 bilateral with immedate capillary fill time. Pedal hair growth present. There is no pain with calf compression, swelling, warmth, erythema.   Neruologic: Grossly intact via light touch bilateral. Vibratory intact via tuning fork bilateral. Protective threshold with Semmes Wienstein monofilament intact to all pedal sites bilateral.   Musculoskeletal: There is mild continued tenderness palpation along the plantar medial tubercle of the calcaneus at the insertion of the plantar fascia on the left foot. There is no pain along the course of the plantar fascia within the arch of the foot. Plantar fascia appears to be intact bilaterally. There is no pain with lateral compression of the calcaneus and there is no pain with vibratory sensation. There is no pain along the course or insertion of the Achilles tendon. There are no other areas of tenderness to bilateral lower extremities. No gross boney pedal deformities bilateral. No pain,  crepitus, or limitation noted with foot and ankle range of motion bilateral. Muscular strength 5/5 in all groups tested bilateral.  Gait: Unassisted, Nonantalgic.   Assessment: Presents for follow-up evaluation for heel pain, likely plantar fasciitis   Plan: -Treatment options discussed including all alternatives, risks, and complications -Patient elects to proceed with steroid injection into the left heel. Under sterile skin preparation, a total of 2.5cc of kenalog 10, 0.5% Marcaine plain, and 2% lidocaine plain were infiltrated into the symptomatic area without complication. A band-aid was applied. Patient tolerated the injection well without complication. Post-injection care with discussed with the patient. Discussed with the patient to ice the area over the next couple of days to help prevent a steroid flare.  -Continue plantar fascial brace. -Discussed inserts for shoes. She can go to [employer] to look inserts.Coupon given.  -Ice and stretching exercises on a daily basis. -Continue supportive shoe gear. -Follow-up in 4 weeks if needed or sooner if any problems arise. In the meantime, encouraged to call the office with any questions, concerns, change in symptoms.   Celesta Gentile, DPM

## 2016-12-07 ENCOUNTER — Other Ambulatory Visit: Payer: Self-pay | Admitting: Podiatry

## 2016-12-22 ENCOUNTER — Ambulatory Visit: Payer: BLUE CROSS/BLUE SHIELD | Admitting: Podiatry

## 2016-12-31 DIAGNOSIS — E785 Hyperlipidemia, unspecified: Secondary | ICD-10-CM | POA: Diagnosis not present

## 2016-12-31 DIAGNOSIS — I10 Essential (primary) hypertension: Secondary | ICD-10-CM | POA: Diagnosis not present

## 2016-12-31 DIAGNOSIS — F3342 Major depressive disorder, recurrent, in full remission: Secondary | ICD-10-CM | POA: Diagnosis not present

## 2016-12-31 DIAGNOSIS — M2550 Pain in unspecified joint: Secondary | ICD-10-CM | POA: Diagnosis not present

## 2017-02-11 DIAGNOSIS — Z800 Family history of malignant neoplasm of digestive organs: Secondary | ICD-10-CM | POA: Diagnosis not present

## 2017-02-11 DIAGNOSIS — Z803 Family history of malignant neoplasm of breast: Secondary | ICD-10-CM | POA: Diagnosis not present

## 2017-02-11 DIAGNOSIS — Z6825 Body mass index (BMI) 25.0-25.9, adult: Secondary | ICD-10-CM | POA: Diagnosis not present

## 2017-02-11 DIAGNOSIS — Z1231 Encounter for screening mammogram for malignant neoplasm of breast: Secondary | ICD-10-CM | POA: Diagnosis not present

## 2017-02-11 DIAGNOSIS — Z806 Family history of leukemia: Secondary | ICD-10-CM | POA: Diagnosis not present

## 2017-02-11 DIAGNOSIS — Z8042 Family history of malignant neoplasm of prostate: Secondary | ICD-10-CM | POA: Diagnosis not present

## 2017-02-11 DIAGNOSIS — Z01419 Encounter for gynecological examination (general) (routine) without abnormal findings: Secondary | ICD-10-CM | POA: Diagnosis not present

## 2017-04-10 DIAGNOSIS — R110 Nausea: Secondary | ICD-10-CM | POA: Diagnosis not present

## 2017-04-10 DIAGNOSIS — G43009 Migraine without aura, not intractable, without status migrainosus: Secondary | ICD-10-CM | POA: Diagnosis not present

## 2017-04-13 ENCOUNTER — Emergency Department (HOSPITAL_BASED_OUTPATIENT_CLINIC_OR_DEPARTMENT_OTHER)
Admission: EM | Admit: 2017-04-13 | Discharge: 2017-04-13 | Disposition: A | Discharge status: Home / Self Care | Payer: BLUE CROSS/BLUE SHIELD | Attending: Emergency Medicine | Admitting: Emergency Medicine

## 2017-04-13 ENCOUNTER — Encounter (HOSPITAL_BASED_OUTPATIENT_CLINIC_OR_DEPARTMENT_OTHER): Payer: Self-pay | Admitting: Emergency Medicine

## 2017-04-13 ENCOUNTER — Other Ambulatory Visit: Payer: Self-pay

## 2017-04-13 DIAGNOSIS — B0230 Zoster ocular disease, unspecified: Secondary | ICD-10-CM

## 2017-04-13 DIAGNOSIS — Z79899 Other long term (current) drug therapy: Secondary | ICD-10-CM | POA: Insufficient documentation

## 2017-04-13 DIAGNOSIS — B028 Zoster with other complications: Secondary | ICD-10-CM | POA: Insufficient documentation

## 2017-04-13 DIAGNOSIS — I10 Essential (primary) hypertension: Secondary | ICD-10-CM | POA: Insufficient documentation

## 2017-04-13 DIAGNOSIS — R21 Rash and other nonspecific skin eruption: Secondary | ICD-10-CM | POA: Diagnosis not present

## 2017-04-13 MED ORDER — OFLOXACIN 0.3 % OP SOLN: 1.0000 [drp] | Freq: Four times a day (QID) | OPHTHALMIC | 0 refills | Status: DC

## 2017-04-13 MED ORDER — TETRACAINE HCL 0.5 % OP SOLN
OPHTHALMIC | Status: AC
  Filled 2017-04-13: qty 4

## 2017-04-13 MED ORDER — VALACYCLOVIR HCL 1 G PO TABS: 1000.0000 mg | ORAL_TABLET | Freq: Three times a day (TID) | ORAL | 0 refills | Status: AC

## 2017-04-13 MED ORDER — FLUORESCEIN SODIUM 1 MG OP STRP
1.0000 | ORAL_STRIP | Freq: Once | OPHTHALMIC | Status: AC
  Administered 2017-04-13: 1 via OPHTHALMIC
  Filled 2017-04-13: qty 1

## 2017-04-13 NOTE — Discharge Instructions (Addendum)
Shingles is considered contagious.  This is especially dangerous to pregnant women, the elderly, and anyone who is immune suppressed.  Please take Ibuprofen (Advil, motrin) and Tylenol (acetaminophen) to relieve your pain.  You may take up to 600 MG (3 pills) of normal strength ibuprofen every 8 hours as needed.  In between doses of ibuprofen you make take tylenol, up to 1,000 mg (two extra strength pills).  Do not take more than 3,000 mg tylenol in a 24 hour period.  Please check all medication labels as many medications such as pain and cold medications may contain tylenol.  Do not drink alcohol while taking these medications.  Do not take other NSAID'S while taking ibuprofen (such as aleve or naproxen).  Please take ibuprofen with food to decrease stomach upset.

## 2017-04-13 NOTE — ED Triage Notes (Addendum)
Patient states that she started to have a headache on Thursday - she reports that she was treated for a MHA on Saturday - the patient reports that she started to have a rash and open soars to her right face and down into her eye  - patient has a possible blister in her eye as well

## 2017-04-13 NOTE — ED Notes (Signed)
ED Provider at bedside discussing plan of care for dispo 

## 2017-04-13 NOTE — ED Provider Notes (Signed)
[facility] DEPART[facility] Provider Note   CSN: [id number] Arrival date & time: 04/13/17  1256     History   Chief Complaint Chief Complaint  Patient presents with  . Rash    HPI Kristin Ramsey is a 56 y.o. female who presents today for evaluation of a rash on her face.  She reports that for the past 4-5 days she has had a headache with severe pains on the right side of her forehead, right eye.  She initially suspected this was a migraine, however did not get relief from Imitrex which normally works well for her.  She reports that today she noticed a rash developing in the area where she has pain, along with around her right eye.  Rash does not go under her nose.  She reports that her vision is normal and right eye, however feels like it is "goopy."  She denies being immunosuppressed, has not had any rashes like this before.  She denies any fevers or chills.  No other concerns today.  HPI  Past Medical History:  Diagnosis Date  . Anxiety   . Hypertension     Patient Active Problem List   Diagnosis Date Noted  . Plantar fasciitis, left 11/24/2016  . CHEST PAIN 03/04/2010  . ABDOMINAL PAIN, EPIGASTRIC 05/21/2009  . ANEMIA 12/20/2007  . ANXIETY DISORDER 12/20/2007  . CONSTIPATION 12/20/2007  . IRRITABLE BOWEL SYNDROME 12/20/2007  . INTERSTITIAL CYSTITIS 12/20/2007  . HEADACHE, CHRONIC 12/20/2007  . URI 12/19/2007  . ESOPHAGITIS 12/19/2007  . GERD 12/19/2007    History reviewed. No pertinent surgical history.  OB History    No data available       Home Medications    Prior to Admission medications   Medication Sig Start Date End Date Taking? Authorizing Provider  escitalopram (LEXAPRO) 10 MG tablet  11/02/16   [provider]  hydrochlorothiazide (HYDRODIURIL) 12.5 MG tablet TK 1 T PO QAM 10/04/16   [provider]  meloxicam (MOBIC) 7.5 MG tablet TAKE 1 TABLET BY MOUTH EVERY DAY 12/07/16   Trula Slade, DPM  metoprolol  succinate (TOPROL-XL) 50 MG 24 hr tablet TK 1 AND 1/2 TS PO QD 10/04/16   [provider]  ofloxacin (OCUFLOX) 0.3 % ophthalmic solution Place 1 drop into the right eye 4 (four) times daily. 04/13/17   Lorin Glass, PA-C  valACYclovir (VALTREX) 1000 MG tablet Take 1 tablet (1,000 mg total) by mouth 3 (three) times daily for 14 days. 04/13/17 04/27/17  Lorin Glass, PA-C    Family History History reviewed. No pertinent family history.  Social History Social History   Tobacco Use  . Smoking status: Unknown If Ever Smoked  . Smokeless tobacco: Never Used  Substance Use Topics  . Alcohol use: Not on file  . Drug use: Not on file     Allergies   Erythromycin and Sulfamethoxazole-trimethoprim   Review of Systems Review of Systems  Constitutional: Negative for chills and fever.  HENT: Negative for ear pain and sore throat.   Eyes: Positive for pain and discharge. Negative for visual disturbance.  Respiratory: Negative for cough and shortness of breath.   Cardiovascular: Negative for chest pain and palpitations.  Gastrointestinal: Negative for abdominal pain and vomiting.  Genitourinary: Negative for dysuria and hematuria.  Musculoskeletal: Negative for arthralgias and back pain.  Skin: Positive for rash. Negative for color change.  Neurological: Positive for headaches. Negative for seizures and syncope.  All other systems reviewed and  are negative.    Physical Exam Updated Vital Signs BP (!) 168/102 (BP Location: Right Arm)   Pulse 61   Temp 98.2 F (36.8 C) (Oral)   Resp 16   Ht 5\' 7"  (1.702 m)   Wt 73.9 kg (163 lb)   SpO2 100%   BMI 25.53 kg/m   Physical Exam  Constitutional: She is oriented to person, place, and time. She appears well-developed and well-nourished. No distress.  HENT:  Head: Normocephalic and atraumatic.  Right Ear: Tympanic membrane, external ear and ear canal normal.  Left Ear: Tympanic membrane, external ear and ear canal  normal.  Nose: Nose normal.  Mouth/Throat: Oropharynx is clear and moist.  Eyes: Conjunctivae are normal. Pupils are equal, round, and reactive to light. Right eye exhibits discharge (Clear, watery). Left eye exhibits no discharge. Right conjunctiva is not injected. Right conjunctiva has no hemorrhage. Left conjunctiva is not injected. No scleral icterus. Right eye exhibits normal extraocular motion. Left eye exhibits abnormal extraocular motion.  Slit lamp exam:      The right eye shows fluorescein uptake.  There are vesicles around the eye, including on the eyelid margin.  Right eye has fluorescein uptake in a dendritic pattern.  Neck: Normal range of motion.  Cardiovascular: Normal rate and regular rhythm.  Pulmonary/Chest: Effort normal. No stridor. No respiratory distress.  Abdominal: She exhibits no distension.  Musculoskeletal: She exhibits no edema or deformity.  Neurological: She is alert and oriented to person, place, and time. She exhibits normal muscle tone.  Skin: Skin is warm and dry. She is not diaphoretic.  There is a rash present to the right side of the face, consistent with trigeminal nerve V1 distribution.  The rash is vesicular in nature.  Rash does not extend onto nose, however pain does.  Rash is obviously present around right eye.  Psychiatric: She has a normal mood and affect. Her behavior is normal.  Nursing note and vitals reviewed.      ED Treatments / Results  Labs (all labs ordered are listed, but only abnormal results are displayed) Labs Reviewed - No data to display  EKG  EKG Interpretation None       Radiology No results found.  Procedures Procedures (including critical care time)  Medications Ordered in ED Medications  fluorescein ophthalmic strip 1 strip (1 strip Right Eye Given 04/13/17 1600)     Initial Impression / Assessment and Plan / ED Course  I have reviewed the triage vital signs and the nursing notes.  Pertinent labs &  imaging results that were available during my care of the patient were reviewed by me and considered in my medical decision making (see chart for details).  Clinical Course as of Apr 13 2005  Tue Apr 13, 2017  1539 Called Dr. Katy Fitch  Valtrex 1g tid 2 week supply Ofloxacin tid Asked me to give patient his cell phone number for her to contact him directly to set up eye exam either today or tomorrow.  [EH]    Clinical Course User Index [EH] Lorin Glass, PA-C   Marita Snellen presents today for evaluation of pain, followed by rash on the right upper face, consistent with shingles.  While the rash does not go on to her nose, she has rash involvement of the periorbital structures.  The eye was stained with fluorescein which showed uptake in a dendritic-like pattern.  Ophthalmology Dr.Groat was consulted who recommended prescription for Valtrex, 1 g, 3 times daily with a  2-week supply, along with ofloxacin drops 3 times daily.  He has me to give patient his contact information, and to instruct her to contact him to arrange for follow-up eye exam either today or tomorrow.  Patient was instructed that herpes zoster/shingles is considered contagious and that she especially needs to avoid those who may be immunosuppressed, elderly, young, or pregnant. She was instructed on over-the-counter pain management, and stated her understanding.  She was instructed not to wear contacts while she has this rash.  She was given return precautions, and states her understanding.  She was told that while in the emergency room her blood pressure was elevated and she needs to follow-up with her doctor for reassessment.    Final Clinical Impressions(s) / ED Diagnoses   Final diagnoses:  Herpes zoster with ophthalmic complication, unspecified herpes zoster eye disease    ED Discharge Orders        Ordered    valACYclovir (VALTREX) 1000 MG tablet  3 times daily     04/13/17 1557    ofloxacin (OCUFLOX) 0.3 %  ophthalmic solution  4 times daily     04/13/17 1557       Ollen Gross 04/13/17 2011    Gareth Morgan, MD 04/15/17 1326

## 2017-04-14 DIAGNOSIS — B0239 Other herpes zoster eye disease: Secondary | ICD-10-CM | POA: Diagnosis not present

## 2017-04-16 DIAGNOSIS — B028 Zoster with other complications: Secondary | ICD-10-CM | POA: Diagnosis not present

## 2017-04-26 DIAGNOSIS — B0239 Other herpes zoster eye disease: Secondary | ICD-10-CM | POA: Diagnosis not present

## 2017-05-04 DIAGNOSIS — B0229 Other postherpetic nervous system involvement: Secondary | ICD-10-CM | POA: Diagnosis not present

## 2017-05-04 DIAGNOSIS — J3489 Other specified disorders of nose and nasal sinuses: Secondary | ICD-10-CM | POA: Diagnosis not present

## 2017-05-10 ENCOUNTER — Encounter: Payer: Self-pay | Admitting: Neurology

## 2017-05-10 ENCOUNTER — Ambulatory Visit: Payer: BLUE CROSS/BLUE SHIELD | Admitting: Neurology

## 2017-05-10 VITALS — BP 125/74 | HR 74 | Ht 67.0 in | Wt 151.0 lb

## 2017-05-10 DIAGNOSIS — B0229 Other postherpetic nervous system involvement: Secondary | ICD-10-CM | POA: Diagnosis not present

## 2017-05-10 MED ORDER — OXCARBAZEPINE 150 MG PO TABS: 150.0000 mg | ORAL_TABLET | Freq: Two times a day (BID) | ORAL | 11 refills | Status: DC

## 2017-05-10 NOTE — Progress Notes (Signed)
PATIENT: Kristin Ramsey DOB: 07/22/1960  Chief Complaint  Patient presents with  . Postherpetic Neuralgia    She is here with her husband, Percell Miller. She was diagnosed with shingles on 04/09/18.  She is still having numbness, tingling and pain in her head/face.  She started gabapentin 300mg , one capsule at bedtime two nights ago with only mild improvement.  She is having difficulty opening her eyes due to discomfort and light sensitivity.  Marland Kitchen PCP    Cari Caraway, MD     HISTORICAL  Kristin Ramsey is a 57 year old female, accompanied by her husband for primary care, seen in refer by  Cari Caraway, for evaluation of postherpetic neuralgia, initial evaluation was on May 10, 2017.  I reviewed and summarized the referring note, she had a past medical history of major depression, hiatal hernia, she did reported a history of chickenpox in the past,  She has history of chronic migraine, on April 09, 2017, she developed right forehead retro-orbital area headaches, she thought it was her usual migraine headaches, but she had persistent severe pain, went to the emergency room on April 10, 2017, cocktail treatment did not help her headache.  On April 11, 2017, she noticed blisters breaking out at the right forehead region, also noticed right eye light sensitivity, tearing, right upper eyelid swollen, presented to the emergency room on April 13, 2017, was diagnosis with herpes, was given valacyclovir 1 g 3 times a day for 2 weeks,  She was seen by Dr. Carolynn Sayers on April 16, 2017, later in January 2019, she was found to have right coronary involvement, was giving eyedrops ofloxacin 0.3%, Keflex 500 mg twice a day for 10 days,  Now she has persistent right eye sensitivity, tearing, was not able to go back to work, also right forehead region itching, burning, hypersensitivity,  She only took prescribed gabapentin 300 mg twice, does help her sleep, but complains of drowsiness, she  also complains of worsening depression anxiety.   REVIEW OF SYSTEMS: Full 14 system review of systems performed and notable only for itching, fatigue, palpitation, blurred vision, loss of vision, eye pain, constipation, feeling cold, feeling hot, allergy, runny nose, skin sensitivity  ALLERGIES: Allergies  Allergen Reactions  . Erythromycin   . Latex Itching  . Sulfamethoxazole-Trimethoprim     HOME MEDICATIONS: Current Outpatient Medications  Medication Sig Dispense Refill  . escitalopram (LEXAPRO) 10 MG tablet     . gabapentin (NEURONTIN) 300 MG capsule Take 300 mg by mouth at bedtime.  0  . hydrochlorothiazide (HYDRODIURIL) 12.5 MG tablet TK 1 T PO QAM  2  . metoprolol succinate (TOPROL-XL) 50 MG 24 hr tablet TK 1 AND 1/2 TS PO QD  0   No current facility-administered medications for this visit.     PAST MEDICAL HISTORY: Past Medical History:  Diagnosis Date  . Anxiety   . Hypertension   . Interstitial cystitis   . Shingles     PAST SURGICAL HISTORY: Past Surgical History:  Procedure Laterality Date  . ABDOMINAL HYSTERECTOMY    . HYSTEROSCOPY    . TONSILLECTOMY      FAMILY HISTORY: Family History  Problem Relation Age of Onset  . Crohn's disease Mother   . Hypertension Father   . Anxiety disorder Father   . Macular degeneration Father   . Prostate cancer Father     SOCIAL HISTORY:  Social History   Socioeconomic History  . Marital status: Married    Spouse name: Not on  file  . Number of children: 1  . Years of education: 60  . Highest education level: Bachelor's degree (e.g., BA, AB, BS)  Social Needs  . Financial resource strain: Not on file  . Food insecurity - worry: Not on file  . Food insecurity - inability: Not on file  . Transportation needs - medical: Not on file  . Transportation needs - non-medical: Not on file  Occupational History  . 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 Smoking status: Never Smoker  . Smokeless tobacco:  Never Used  Substance and Sexual Activity  . Alcohol use: No    Frequency: Never  . Drug use: No  . Sexual activity: Not on file  Other Topics Concern  . Not on file  Social History Narrative   Lives at home with husband.   Right-handed.   0.5 cup of coffee per day, occasional soda.     PHYSICAL EXAM   Vitals:   05/10/17 1457  BP: 125/74  Pulse: 74  Weight: 151 lb (68.5 kg)  Height: 5\' 7"  (1.702 m)    Not recorded      Body mass index is 23.65 kg/m.  PHYSICAL EXAMNIATION:  Gen: NAD, conversant, well nourised, obese, well groomed                     Cardiovascular: Regular rate rhythm, no peripheral edema, warm, nontender. Eyes: Conjunctivae clear without exudates or hemorrhage Neck: Supple, no carotid bruits. Pulmonary: Clear to auscultation bilaterally   NEUROLOGICAL EXAM:  MENTAL STATUS: Speech:    Speech is normal; fluent and spontaneous with normal comprehension.  Cognition:     Orientation to time, place and person     Normal recent and remote memory     Normal Attention span and concentration     Normal Language, naming, repeating,spontaneous speech     Fund of knowledge   CRANIAL NERVES: CN II: Extreme light-sensitive, tearing from right eye, left cornea was normal, right coronary was sensitive, extra ocular movement was normal  CN III, IV, VI: extraocular movement are normal. No ptosis. CN V: Right V1 distribution dried scaly skin, loss of sensation, right V2 hypersensitivity, right V3 was normal CN VII: Face is symmetric with normal eye closure and smile. CN VIII: Hearing is normal to rubbing fingers CN IX, X: Palate elevates symmetrically. Phonation is normal. CN XI: Head turning and shoulder shrug are intact CN XII: Tongue is midline with normal movements and no atrophy.  MOTOR: There is no pronator drift of out-stretched arms. Muscle bulk and tone are normal. Muscle strength is normal.  REFLEXES: Reflexes are 2+ and symmetric at the  biceps, triceps, knees, and ankles. Plantar responses are flexor.  SENSORY: Intact to light touch, pinprick, positional sensation and vibratory sensation are intact in fingers and toes.  COORDINATION: Rapid alternating movements and fine finger movements are intact. There is no dysmetria on finger-to-nose and heel-knee-shin.    GAIT/STANCE: Posture is normal. Gait is steady with normal steps, base, arm swing, and turning. Heel and toe walking are normal. Tandem gait is normal.  Romberg is absent.   DIAGNOSTIC DATA (LABS, IMAGING, TESTING) - I reviewed patient records, labs, notes, testing and imaging myself where available.   ASSESSMENT AND PLAN  AZARIA STEGMAN is a 57 y.o. female   Right herpes simplex virus infection, mainly involving right V1, some involvement in right V2,  Add on Trileptal 150 mg twice a day  Gabapentin 300 mg  3 times a day   Marcial Pacas, M.D. Ph.D.  [facility] [address] [place], [place] [address] Ph: [phone] Fax: [phone]  CC: Cari Caraway, MD

## 2017-05-11 ENCOUNTER — Telehealth: Payer: Self-pay | Admitting: Neurology

## 2017-05-11 ENCOUNTER — Encounter: Payer: Self-pay | Admitting: *Deleted

## 2017-05-11 NOTE — Telephone Encounter (Signed)
Left message requesting a return call.

## 2017-05-11 NOTE — Telephone Encounter (Signed)
Pt is requesting we fax over medical notes stating that pt can return to work once reevaluated fax # [phone] Attention [place]

## 2017-05-11 NOTE — Telephone Encounter (Signed)
Letter has been written, signed by Dr. Krista Blue and faxed to her employer.

## 2017-05-17 ENCOUNTER — Telehealth: Payer: Self-pay | Admitting: Neurology

## 2017-05-17 MED ORDER — CLONAZEPAM 0.5 MG PO TABS: ORAL_TABLET | ORAL | 2 refills | Status: DC

## 2017-05-17 NOTE — Telephone Encounter (Signed)
Pt called she said the itching is uncontrollable. She has used the cool cloths and calamine lotion but it is not helping at all. Pt said took Clonazepam .5mg  1/2 tablet this morning. She is "afraid" to take too much medication. She wanting to know if she should be taking that with the gabapentin. Please call to advise

## 2017-05-17 NOTE — Telephone Encounter (Addendum)
She is taking gabapentin 300mg , one capsule TID and oxcarbazepine 150mg , one tablet at bedtime.  She has been instructed to go ahead start taking the oxcarbazepine 150mg , BID as prescribed.  Also, Dr. Krista Blue offered her lidocaine ointment but she has this at home and it has not helped.  She feels clonazepam helps the most.  Per vo by Dr.Yan, provide clonazepam 0.5mg  prescription, one tablet daily prn.  Pt is appreciative.  She will call back if her symptoms do not improve.

## 2017-05-27 DIAGNOSIS — B0239 Other herpes zoster eye disease: Secondary | ICD-10-CM | POA: Diagnosis not present

## 2017-06-09 ENCOUNTER — Telehealth: Payer: Self-pay | Admitting: Neurology

## 2017-06-09 MED ORDER — GABAPENTIN 300 MG PO CAPS: 300.0000 mg | ORAL_CAPSULE | Freq: Three times a day (TID) | ORAL | 5 refills | Status: DC

## 2017-06-09 NOTE — Telephone Encounter (Signed)
Per last office note - continue gabapentin 300mg , one capsule TID.  New Rx sent to requested pharmacy.

## 2017-06-09 NOTE — Telephone Encounter (Signed)
Pt is needing a new prescription for gabapentin (NEURONTIN) 300 MG capsule stating she only has 1 left  Sent to [facility]

## 2017-06-10 ENCOUNTER — Encounter (INDEPENDENT_AMBULATORY_CARE_PROVIDER_SITE_OTHER): Payer: Self-pay

## 2017-06-10 ENCOUNTER — Ambulatory Visit: Payer: BLUE CROSS/BLUE SHIELD | Admitting: Neurology

## 2017-06-10 ENCOUNTER — Encounter: Payer: Self-pay | Admitting: Neurology

## 2017-06-10 VITALS — BP 112/68 | HR 67 | Ht 67.0 in | Wt 148.0 lb

## 2017-06-10 DIAGNOSIS — B0229 Other postherpetic nervous system involvement: Secondary | ICD-10-CM | POA: Diagnosis not present

## 2017-06-10 MED ORDER — GABAPENTIN 300 MG PO CAPS: 300.0000 mg | ORAL_CAPSULE | Freq: Three times a day (TID) | ORAL | 4 refills | Status: DC

## 2017-06-10 MED ORDER — OXCARBAZEPINE ER 300 MG PO TB24: 300.0000 mg | ORAL_TABLET | Freq: Every day | ORAL | 11 refills | Status: DC

## 2017-06-10 NOTE — Progress Notes (Signed)
PATIENT: Kristin Ramsey DOB: 08/28/1960  Chief Complaint  Patient presents with  . Follow-up    PCP: Dr. Cari Caraway     HISTORICAL  Kristin Ramsey is a 57 year old female, accompanied by her husband for primary care, seen in refer by  Cari Caraway, for evaluation of postherpetic neuralgia, initial evaluation was on May 10, 2017.  I reviewed and summarized the referring note, she had a past medical history of major depression, hiatal hernia, she did reported a history of chickenpox in the past,  She has history of chronic migraine, on April 09, 2017, she developed right forehead retro-orbital area headaches, she thought it was her usual migraine headaches, but she had persistent severe pain, went to the emergency room on April 10, 2017, cocktail treatment did not help her headache.  On April 11, 2017, she noticed blisters breaking out at the right forehead region, also noticed right eye light sensitivity, tearing, right upper eyelid swollen, presented to the emergency room on April 13, 2017, was diagnosis with herpes, was given valacyclovir 1 g 3 times a day for 2 weeks,  She was seen by Dr. Carolynn Sayers on April 16, 2017, later in January 2019, she was found to have right coronary involvement, was giving eyedrops ofloxacin 0.3%, Keflex 500 mg twice a day for 10 days,  Now she has persistent right eye sensitivity, tearing, was not able to go back to work, also right forehead region itching, burning, hypersensitivity,  She only took prescribed gabapentin 300 mg twice, does help her sleep, but complains of drowsiness, she also complains of worsening depression anxiety.  UPDATE Jun 10 2017: She has never tried Trileptal 150 mg twice a day as prescribed, worried about the side effect, overall her symptoms has improved, she is no longer has constant right eye tearing, but still has right eye foreign object sensation, no loss of vision, was seen by her ophthalmologist  recently, was told she is healing well, she continue has mild right upper eyelid swelling, right forehead skin discoloration, swelling, she is taking gabapentin 300 mg up to 4-5 tablets each day, right forehead region neuropathic pain, complains of fatigue, drowsiness, on short-term disability.  Also taking clonazepam 0.5 mg every night to help the itching and sleep,  REVIEW OF SYSTEMS: Full 14 system review of systems performed and notable only for activity change, appetite change, fatigue, runny nose, constipation, dizziness, headaches, numbness, agitation, decreased concentration, depression anxiety  ALLERGIES: Allergies  Allergen Reactions  . Erythromycin   . Latex Itching  . Sulfamethoxazole-Trimethoprim     HOME MEDICATIONS: Current Outpatient Medications  Medication Sig Dispense Refill  . clonazePAM (KLONOPIN) 0.5 MG tablet Take one tablet daily as needed. 30 tablet 2  . escitalopram (LEXAPRO) 10 MG tablet     . gabapentin (NEURONTIN) 300 MG capsule Take 1 capsule (300 mg total) by mouth 3 (three) times daily. 90 capsule 5  . hydrochlorothiazide (HYDRODIURIL) 12.5 MG tablet TK 1 T PO QAM  2  . metoprolol succinate (TOPROL-XL) 50 MG 24 hr tablet TK 1 AND 1/2 TS PO QD  0  . OXcarbazepine (TRILEPTAL) 150 MG tablet Take 1 tablet (150 mg total) by mouth 2 (two) times daily. 60 tablet 11   No current facility-administered medications for this visit.     PAST MEDICAL HISTORY: Past Medical History:  Diagnosis Date  . Anxiety   . Hypertension   . Interstitial cystitis   . Shingles     PAST SURGICAL HISTORY: Past  Surgical History:  Procedure Laterality Date  . ABDOMINAL HYSTERECTOMY    . HYSTEROSCOPY    . TONSILLECTOMY      FAMILY HISTORY: Family History  Problem Relation Age of Onset  . Crohn's disease Mother   . Hypertension Father   . Anxiety disorder Father   . Macular degeneration Father   . Prostate cancer Father     SOCIAL HISTORY:  Social History    Socioeconomic History  . Marital status: Married    Spouse name: Not on file  . Number of children: 1  . Years of education: 34  . Highest education level: Bachelor's degree (e.g., BA, AB, BS)  Social Needs  . Financial resource strain: Not on file  . Food insecurity - worry: Not on file  . Food insecurity - inability: Not on file  . Transportation needs - medical: Not on file  . Transportation needs - non-medical: Not on file  Occupational History  . 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 Smoking status: Never Smoker  . Smokeless tobacco: Never Used  Substance and Sexual Activity  . Alcohol use: No    Frequency: Never  . Drug use: No  . Sexual activity: Not on file  Other Topics Concern  . Not on file  Social History Narrative   Lives at home with husband.   Right-handed.   0.5 cup of coffee per day, occasional soda.     PHYSICAL EXAM   Vitals:   06/10/17 1257  BP: 112/68  Pulse: 67  Weight: 148 lb (67.1 kg)  Height: 5\' 7"  (1.702 m)    Not recorded      Body mass index is 23.18 kg/m.  PHYSICAL EXAMNIATION:  Gen: NAD, conversant, well nourised, obese, well groomed                     Cardiovascular: Regular rate rhythm, no peripheral edema, warm, nontender. Eyes: Conjunctivae clear without exudates or hemorrhage Neck: Supple, no carotid bruits. Pulmonary: Clear to auscultation bilaterally  Skin: Right V1 distribution skin erythematous, discoloration, also involving right upper eyelid  NEUROLOGICAL EXAM:  MENTAL STATUS: Speech:    Speech is normal; fluent and spontaneous with normal comprehension.  Cognition:     Orientation to time, place and person     Normal recent and remote memory     Normal Attention span and concentration     Normal Language, naming, repeating,spontaneous speech     Fund of knowledge   CRANIAL NERVES: CN II: pupils are equal round reactive to light,, extra ocular movement was normal CN III, IV, VI: extraocular  movement are normal. No ptosis. CN V: Right V1 distribution dried scaly skin, loss of sensation, right V2 hypersensitivity, right V3 was normal CN VII: Face is symmetric with normal eye closure and smile. CN VIII: Hearing is normal to rubbing fingers CN IX, X: Palate elevates symmetrically. Phonation is normal. CN XI: Head turning and shoulder shrug are intact CN XII: Tongue is midline with normal movements and no atrophy.  MOTOR: There is no pronator drift of out-stretched arms. Muscle bulk and tone are normal. Muscle strength is normal.  REFLEXES: Reflexes are 2+ and symmetric at the biceps, triceps, knees, and ankles. Plantar responses are flexor.  SENSORY: Decreased light touch, with allodynia right V1 distribution, mild right V2 involvement.  COORDINATION: Rapid alternating movements and fine finger movements are intact. There is no dysmetria on finger-to-nose and heel-knee-shin.    GAIT/STANCE: Posture is normal.  Gait is steady with normal steps, base, arm swing, and turning. Heel and toe walking are normal. Tandem gait is normal.  Romberg is absent.   DIAGNOSTIC DATA (LABS, IMAGING, TESTING) - I reviewed patient records, labs, notes, testing and imaging myself where available.   ASSESSMENT AND PLAN  Kristin Ramsey is a 57 y.o. female   Right herpes simplex virus infection, mainly involving right V1, some involvement in right V2,  Change to oxtellar ER 300mg  qhs  Gabapentin 300 mg 3-5  times a day  Gradually tapering off clonazepam   Marcial Pacas, M.D. Ph.D.  [facility] [address], [place], [place] [address] Ph: [phone] Fax: [phone]  CC: Cari Caraway, MD

## 2017-06-16 ENCOUNTER — Encounter: Payer: Self-pay | Admitting: *Deleted

## 2017-06-16 ENCOUNTER — Telehealth: Payer: Self-pay | Admitting: Neurology

## 2017-06-16 NOTE — Telephone Encounter (Signed)
Spoke to Kristin Ramsey - letter has been written, signed by Dr. Krista Blue and faxed to her supervisor.  She is returning to work on 07/02/17 for half days.  She is returning full time, without restrictions, on 07/09/17.  [facility] - attention Elisabeth Cara - fax [phone]

## 2017-06-16 NOTE — Telephone Encounter (Signed)
Pt is wanting to return to work. She needs a release to return to work. Please call to advise

## 2017-07-01 ENCOUNTER — Telehealth: Payer: Self-pay | Admitting: Neurology

## 2017-07-01 MED ORDER — GABAPENTIN 300 MG PO CAPS: 600.0000 mg | ORAL_CAPSULE | Freq: Three times a day (TID) | ORAL | 3 refills | Status: DC

## 2017-07-01 NOTE — Telephone Encounter (Signed)
Pt is taking gabapentin (NEURONTIN) 300 MG capsule between 4- 6 tablets prn. She needs new RX sent to [facility]. Pt said the pain is a little worse this week but she feels It maybe due to getting her washed at the hair salon and the pulling of her hair could have aggravated the shingles.

## 2017-07-01 NOTE — Telephone Encounter (Signed)
Per vo by Dr. Krista Blue, increase rx for gabapentin to 300mg , 2 tablets TID.  Patient is aware and agreeable to this change.  New rx sent to pharmacy.

## 2017-07-08 DIAGNOSIS — B0239 Other herpes zoster eye disease: Secondary | ICD-10-CM | POA: Diagnosis not present

## 2017-09-06 NOTE — Progress Notes (Signed)
[facility]  PATIENT: Kristin Ramsey DOB: 08/26/1960   REASON FOR VISIT: Follow-up for post herpetic neuralgia HISTORY FROM: Patient    HISTORY OF PRESENT ILLNESS: Kristin Ramsey is a 57 year old female, accompanied by her husband for primary care, seen in refer by  Cari Caraway, for evaluation of postherpetic neuralgia, initial evaluation was on May 10, 2017.  I reviewed and summarized the referring note, she had a past medical history of major depression, hiatal hernia, she did reported a history of chickenpox in the past,  She has history of chronic migraine, on April 09, 2017, she developed right forehead retro-orbital area headaches, she thought it was her usual migraine headaches, but she had persistent severe pain, went to the emergency room on April 10, 2017, cocktail treatment did not help her headache.  On April 11, 2017, she noticed blisters breaking out at the right forehead region, also noticed right eye light sensitivity, tearing, right upper eyelid swollen, presented to the emergency room on April 13, 2017, was diagnosis with herpes, was given valacyclovir 1 g 3 times a day for 2 weeks,  She was seen by Dr. Carolynn Sayers on April 16, 2017, later in January 2019, she was found to have right coronary involvement, was giving eyedrops ofloxacin 0.3%, Keflex 500 mg twice a day for 10 days,  Now she has persistent right eye sensitivity, tearing, was not able to go back to work, also right forehead region itching, burning, hypersensitivity,  She only took prescribed gabapentin 300 mg twice, does help her sleep, but complains of drowsiness, she also complains of worsening depression anxiety.  UPDATE Jun 10 2017:YY She has never tried Trileptal 150 mg twice a day as prescribed, worried about the side effect, overall her symptoms has improved, she is no longer has constant right eye tearing, but still has right eye foreign object  sensation, no loss of vision, was seen by her ophthalmologist recently, was told she is healing well, she continue has mild right upper eyelid swelling, right forehead skin discoloration, swelling, she is taking gabapentin 300 mg up to 4-5 tablets each day, right forehead region neuropathic pain, complains of fatigue, drowsiness, on short-term disability.  Also taking clonazepam 0.5 mg every night to help the itching and sleep, UPDATE 5/21/19CM Kristin Ramsey, 57 year old female returns for follow-up with history of postherpetic neuralgia.  She has good and bad days.  Still has itching at night that makes it difficult to fall asleep she does  take Klonopin.  She is taking a fourth of the dose.  She remains on Oxtellar 300 mg at bedtime.  She takes gabapentin 300 mg 4-5 day as needed.  She has returned to work.  She still has some mild right upper eyelid swelling.  She no longer has the, right eye tearing, she denies loss of vision and has been told by ophthalmology that she may have some scarring but her vision has not been affected.  She returns for reevaluation  REVIEW OF SYSTEMS: Full 14 system review of systems performed and notable only for those listed, all others are neg:  Constitutional: neg  Cardiovascular: neg Ear/Nose/Throat: neg  Skin: neg Eyes: Itching Respiratory: neg Gastroitestinal: neg  Hematology/Lymphatic: neg  Endocrine: neg Musculoskeletal:neg Allergy/Immunology: neg Neurological: Numbness in the face  Psychiatric: neg Sleep : neg   ALLERGIES: Allergies  Allergen Reactions  . Erythromycin   . Latex Itching  . Sulfamethoxazole-Trimethoprim     HOME MEDICATIONS: Outpatient Medications Prior to Visit  Medication Sig  Dispense Refill  . clonazePAM (KLONOPIN) 0.5 MG tablet Take one tablet daily as needed. 30 tablet 2  . escitalopram (LEXAPRO) 10 MG tablet     . esomeprazole (NEXIUM) 20 MG capsule Take 20 mg by mouth daily at 12 noon.    . gabapentin (NEURONTIN) 300 MG  capsule Take 2 capsules (600 mg total) by mouth 3 (three) times daily. 540 capsule 3  . hydrochlorothiazide (HYDRODIURIL) 12.5 MG tablet TK 1 T PO QAM  2  . metoprolol succinate (TOPROL-XL) 50 MG 24 hr tablet TK 1 AND 1/2 TS PO QD  0  . OXcarbazepine ER (OXTELLAR XR) 300 MG TB24 Take 300 mg by mouth at bedtime. 30 tablet 11   No facility-administered medications prior to visit.     PAST MEDICAL HISTORY: Past Medical History:  Diagnosis Date  . Anxiety   . Hypertension   . Interstitial cystitis   . Shingles     PAST SURGICAL HISTORY: Past Surgical History:  Procedure Laterality Date  . ABDOMINAL HYSTERECTOMY    . HYSTEROSCOPY    . TONSILLECTOMY      FAMILY HISTORY: Family History  Problem Relation Age of Onset  . Crohn's disease Mother   . Hypertension Father   . Anxiety disorder Father   . Macular degeneration Father   . Prostate cancer Father     SOCIAL HISTORY: Social History   Socioeconomic History  . Marital status: Married    Spouse name: Not on file  . Number of children: 1  . Years of education: 60  . Highest education level: Bachelor's degree (e.g., BA, AB, BS)  Occupational History  . 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 Financial resource strain: Not on file  . Food insecurity:    Worry: Not on file    Inability: Not on file  . Transportation needs:    Medical: Not on file    Non-medical: Not on file  Tobacco Use  . Smoking status: Never Smoker  . Smokeless tobacco: Never Used  Substance and Sexual Activity  . Alcohol use: No    Frequency: Never  . Drug use: No  . Sexual activity: Not on file  Lifestyle  . Physical activity:    Days per week: Not on file    Minutes per session: Not on file  . Stress: Not on file  Relationships  . Social connections:    Talks on phone: Not on file    Gets together: Not on file    Attends religious service: Not on file    Active member of club or organization: Not on file    Attends meetings of  clubs or organizations: Not on file    Relationship status: Not on file  . Intimate partner violence:    Fear of current or ex partner: Not on file    Emotionally abused: Not on file    Physically abused: Not on file    Forced sexual activity: Not on file  Other Topics Concern  . Not on file  Social History Narrative   Lives at home with husband.   Right-handed.   0.5 cup of coffee per day, occasional soda.     PHYSICAL EXAM  Vitals:   09/07/17 1306  BP: 131/81  Pulse: (!) 57  Weight: 150 lb 9.6 oz (68.3 kg)  Height: 5\' 7"  (1.702 m)   Body mass index is 23.59 kg/m.  Generalized: Well developed, in no acute distress  Head: normocephalic and atraumatic,. Oropharynx  benign  Neck: Supple,  Musculoskeletal: No deformity   Neurological examination   Mentation: Alert oriented to time, place, history taking. Attention span and concentration appropriate. Recent and remote memory intact.  Follows all commands speech and language fluent.   Cranial nerve II-XII: Pupils were equal round reactive to light extraocular movements were full, visual field were full on confrontational test. Facial sensation and strength were normal. hearing was intact to finger rubbing bilaterally. Uvula tongue midline. head turning and shoulder shrug were normal and symmetric.Tongue protrusion into cheek strength was normal. Motor: normal bulk and tone, full strength in the BUE, BLE,  Sensory: Mild decreased light touch in V1 and V2 distribution Coordination: finger-nose-finger, heel-to-shin bilaterally, no dysmetria Reflexes: Brachioradialis 2/2, biceps 2/2, triceps 2/2, patellar 2/2, Achilles 2/2, plantar responses were flexor bilaterally. Gait and Station: Rising up from seated position without assistance, normal stance,  moderate stride, good arm swing, smooth turning, able to perform tiptoe, and heel walking without difficulty. Tandem gait is steady  DIAGNOSTIC DATA (LABS, IMAGING, TESTING) - I  reviewed patient records, labs, notes, testing and imaging myself where available.  Lab Results  Component Value Date   WBC 4.3 (L) 05/21/2009   HGB 13.0 05/21/2009   HCT 39.1 05/21/2009   MCV 91.9 05/21/2009   PLT 189.0 05/21/2009      Component Value Date/Time   NA 141 05/21/2009 1224   K 4.2 05/21/2009 1224   CL 103 05/21/2009 1224   CO2 30 05/21/2009 1224   GLUCOSE 87 05/21/2009 1224   BUN 9 05/21/2009 1224   CREATININE 0.6 05/21/2009 1224   CALCIUM 9.2 05/21/2009 1224   PROT 7.5 05/21/2009 1224   ALBUMIN 4.3 05/21/2009 1224   AST 20 05/21/2009 1224   ALT 15 05/21/2009 1224   ALKPHOS 51 05/21/2009 1224   BILITOT 0.3 05/21/2009 1224   GFRNONAA 113.92 05/21/2009 1224   GFRAA 116 12/20/2007 0944    Lab Results  Component Value Date   VITAMINB12 290 05/21/2009   Lab Results  Component Value Date   TSH 0.79 05/21/2009      ASSESSMENT AND PLAN   Kristin Ramsey is a 57 y.o. female  with  Right herpes simplex virus infection, mainly involving right V1, some involvement in right V2,              PLAN: Continue Oxtellar 300 mg at bedtime Continue gabapentin 300 mg every 4-5 hours Continue clonazepam at night for itching has tapered to 1/4 dose at night for itching  Follow-up in 6 months Kristin Ramsey, [facility], [facility], [facility] [address], [place], [place] [address] [phone]

## 2017-09-07 ENCOUNTER — Encounter: Payer: Self-pay | Admitting: Nurse Practitioner

## 2017-09-07 ENCOUNTER — Ambulatory Visit: Payer: 59 | Admitting: Nurse Practitioner

## 2017-09-07 VITALS — BP 131/81 | HR 57 | Ht 67.0 in | Wt 150.6 lb

## 2017-09-07 DIAGNOSIS — B0229 Other postherpetic nervous system involvement: Secondary | ICD-10-CM | POA: Diagnosis not present

## 2017-09-07 NOTE — Patient Instructions (Signed)
Continue Oxtellar 300 mg at bedtime Continue gabapentin 300 mg every 4-5 hours Continue clonazepam at night for itching Follow-up in 6 months

## 2017-09-07 NOTE — Progress Notes (Signed)
I have reviewed and agreed above plan. 

## 2017-10-04 DIAGNOSIS — N951 Menopausal and female climacteric states: Secondary | ICD-10-CM | POA: Diagnosis not present

## 2017-10-04 DIAGNOSIS — E785 Hyperlipidemia, unspecified: Secondary | ICD-10-CM | POA: Diagnosis not present

## 2017-10-04 DIAGNOSIS — I10 Essential (primary) hypertension: Secondary | ICD-10-CM | POA: Diagnosis not present

## 2017-10-04 DIAGNOSIS — R69 Illness, unspecified: Secondary | ICD-10-CM | POA: Diagnosis not present

## 2017-10-04 DIAGNOSIS — E559 Vitamin D deficiency, unspecified: Secondary | ICD-10-CM | POA: Diagnosis not present

## 2017-10-04 DIAGNOSIS — G43009 Migraine without aura, not intractable, without status migrainosus: Secondary | ICD-10-CM | POA: Diagnosis not present

## 2017-10-04 DIAGNOSIS — K219 Gastro-esophageal reflux disease without esophagitis: Secondary | ICD-10-CM | POA: Diagnosis not present

## 2017-10-04 DIAGNOSIS — B0229 Other postherpetic nervous system involvement: Secondary | ICD-10-CM | POA: Diagnosis not present

## 2017-12-27 DIAGNOSIS — R05 Cough: Secondary | ICD-10-CM | POA: Diagnosis not present

## 2018-01-01 DIAGNOSIS — B0239 Other herpes zoster eye disease: Secondary | ICD-10-CM | POA: Diagnosis not present

## 2018-01-01 DIAGNOSIS — H04123 Dry eye syndrome of bilateral lacrimal glands: Secondary | ICD-10-CM | POA: Diagnosis not present

## 2018-01-05 ENCOUNTER — Telehealth: Payer: Self-pay

## 2018-01-05 NOTE — Telephone Encounter (Signed)
Rn call [phone], the rep stated the PA can be done on the website or the form can be printed out. RN wanted the printed copy. The rep stated to print out rx benefits form complete,and fax back.

## 2018-01-05 NOTE — Telephone Encounter (Signed)
PA paper form, medications, demographics paper,all of patients office notes fax to [phone]. Fax confirmed,and PA pending for oxtellar ER 300mg .

## 2018-01-05 NOTE — Telephone Encounter (Signed)
Received notification back from [employer] [phone]).  Letter stated that oxcarbazepine XR is not covered based on current plan benefits.  The preferred alternative is oxcarbazepine IR.  I called [facility] and provided them the information off the $0 co-pay card.  They have the prescription ready for pick up now at no cost.

## 2018-01-05 NOTE — Telephone Encounter (Signed)
Rn tried to do PA for [employer] on cover my meds.  A fax was sent stating [employer] does not handle the PA or reviews. Rn has to call [phone].

## 2018-01-06 NOTE — Telephone Encounter (Signed)
Pt states she missed a call from our office, she is asking for RN Sharyn Lull to give her a call re: any new updates

## 2018-01-06 NOTE — Telephone Encounter (Signed)
Spoke to patient - she is aware that the oxcarbazepine XR is being covered by a $0 co-pay.

## 2018-01-17 ENCOUNTER — Telehealth: Payer: Self-pay | Admitting: Nurse Practitioner

## 2018-01-17 NOTE — Telephone Encounter (Signed)
Pt is wanting to discuss trying Lysine pill for herpatic pain. Please call to discuss.

## 2018-01-18 NOTE — Telephone Encounter (Signed)
Spoke to pt and relayed that check with pharmacist about lysine and it is ok to take flu shot.  Pt appreciated call.

## 2018-01-18 NOTE — Telephone Encounter (Signed)
Ask the pharmacist about lysine since it is OTC ok to take flu shot.

## 2018-01-18 NOTE — Telephone Encounter (Signed)
Spoke to pt and she is asking if ok to take lysine (otc) and if have heard of this assisting in herpetic pain?  Also, is she ok to get flu shot this year?

## 2018-02-28 DIAGNOSIS — K589 Irritable bowel syndrome without diarrhea: Secondary | ICD-10-CM | POA: Diagnosis not present

## 2018-02-28 DIAGNOSIS — R8290 Unspecified abnormal findings in urine: Secondary | ICD-10-CM | POA: Diagnosis not present

## 2018-02-28 DIAGNOSIS — R109 Unspecified abdominal pain: Secondary | ICD-10-CM | POA: Diagnosis not present

## 2018-03-10 DIAGNOSIS — Z1231 Encounter for screening mammogram for malignant neoplasm of breast: Secondary | ICD-10-CM | POA: Diagnosis not present

## 2018-03-10 DIAGNOSIS — Z01419 Encounter for gynecological examination (general) (routine) without abnormal findings: Secondary | ICD-10-CM | POA: Diagnosis not present

## 2018-03-10 DIAGNOSIS — N760 Acute vaginitis: Secondary | ICD-10-CM | POA: Diagnosis not present

## 2018-03-10 DIAGNOSIS — Z6825 Body mass index (BMI) 25.0-25.9, adult: Secondary | ICD-10-CM | POA: Diagnosis not present

## 2018-03-10 DIAGNOSIS — E559 Vitamin D deficiency, unspecified: Secondary | ICD-10-CM | POA: Diagnosis not present

## 2018-03-10 DIAGNOSIS — E785 Hyperlipidemia, unspecified: Secondary | ICD-10-CM | POA: Diagnosis not present

## 2018-03-24 DIAGNOSIS — R109 Unspecified abdominal pain: Secondary | ICD-10-CM | POA: Diagnosis not present

## 2018-03-24 DIAGNOSIS — K5900 Constipation, unspecified: Secondary | ICD-10-CM | POA: Diagnosis not present

## 2018-03-28 ENCOUNTER — Other Ambulatory Visit: Payer: Self-pay | Admitting: Gastroenterology

## 2018-03-28 ENCOUNTER — Ambulatory Visit
Admission: RE | Admit: 2018-03-28 | Discharge: 2018-03-28 | Disposition: A | Discharge status: Home / Self Care | Payer: 59 | Source: Ambulatory Visit | Attending: Gastroenterology | Admitting: Gastroenterology

## 2018-03-28 DIAGNOSIS — K5900 Constipation, unspecified: Secondary | ICD-10-CM

## 2018-03-28 DIAGNOSIS — R140 Abdominal distension (gaseous): Secondary | ICD-10-CM

## 2018-03-29 ENCOUNTER — Ambulatory Visit: Payer: 59 | Admitting: Nurse Practitioner

## 2018-05-16 ENCOUNTER — Encounter: Payer: Self-pay | Admitting: Cardiology

## 2018-05-16 DIAGNOSIS — K5900 Constipation, unspecified: Secondary | ICD-10-CM | POA: Diagnosis not present

## 2018-05-16 DIAGNOSIS — G43009 Migraine without aura, not intractable, without status migrainosus: Secondary | ICD-10-CM | POA: Diagnosis not present

## 2018-05-16 DIAGNOSIS — R69 Illness, unspecified: Secondary | ICD-10-CM | POA: Diagnosis not present

## 2018-05-16 DIAGNOSIS — B0229 Other postherpetic nervous system involvement: Secondary | ICD-10-CM | POA: Diagnosis not present

## 2018-05-16 DIAGNOSIS — E559 Vitamin D deficiency, unspecified: Secondary | ICD-10-CM | POA: Diagnosis not present

## 2018-05-16 DIAGNOSIS — I10 Essential (primary) hypertension: Secondary | ICD-10-CM | POA: Diagnosis not present

## 2018-05-16 DIAGNOSIS — E785 Hyperlipidemia, unspecified: Secondary | ICD-10-CM | POA: Diagnosis not present

## 2018-06-07 ENCOUNTER — Other Ambulatory Visit: Payer: Self-pay | Admitting: Obstetrics and Gynecology

## 2018-06-07 DIAGNOSIS — Z803 Family history of malignant neoplasm of breast: Secondary | ICD-10-CM

## 2018-06-11 ENCOUNTER — Other Ambulatory Visit: Payer: Self-pay | Admitting: Neurology

## 2018-06-13 NOTE — Progress Notes (Signed)
[facility]  PATIENT: Kristin Ramsey DOB: 21-Jan-1961   REASON FOR VISIT: Follow-up for post herpetic neuralgia HISTORY FROM: Patient    HISTORY OF PRESENT ILLNESS: Kristin Ramsey is a 58 year old female, accompanied by her husband for primary care, seen in refer by  Cari Caraway, for evaluation of postherpetic neuralgia, initial evaluation was on May 10, 2017.  I reviewed and summarized the referring note, she had a past medical history of major depression, hiatal hernia, she did reported a history of chickenpox in the past,  She has history of chronic migraine, on April 09, 2017, she developed right forehead retro-orbital area headaches, she thought it was her usual migraine headaches, but she had persistent severe pain, went to the emergency room on April 10, 2017, cocktail treatment did not help her headache.  On April 11, 2017, she noticed blisters breaking out at the right forehead region, also noticed right eye light sensitivity, tearing, right upper eyelid swollen, presented to the emergency room on April 13, 2017, was diagnosis with herpes, was given valacyclovir 1 g 3 times a day for 2 weeks,  She was seen by Dr. Carolynn Sayers on April 16, 2017, later in January 2019, she was found to have right coronary involvement, was giving eyedrops ofloxacin 0.3%, Keflex 500 mg twice a day for 10 days,  Now she has persistent right eye sensitivity, tearing, was not able to go back to work, also right forehead region itching, burning, hypersensitivity,  She only took prescribed gabapentin 300 mg twice, does help her sleep, but complains of drowsiness, she also complains of worsening depression anxiety.  UPDATE Jun 10 2017:YY She has never tried Trileptal 150 mg twice a day as prescribed, worried about the side effect, overall her symptoms has improved, she is no longer has constant right eye tearing, but still has right eye foreign object  sensation, no loss of vision, was seen by her ophthalmologist recently, was told she is healing well, she continue has mild right upper eyelid swelling, right forehead skin discoloration, swelling, she is taking gabapentin 300 mg up to 4-5 tablets each day, right forehead region neuropathic pain, complains of fatigue, drowsiness, on short-term disability.  Also taking clonazepam 0.5 mg every night to help the itching and sleep, UPDATE 5/21/19CM Kristin Ramsey, 58 year old female returns for follow-up with history of postherpetic neuralgia.  She has good and bad days.  Still has itching at night that makes it difficult to fall asleep she does  take Klonopin.  She is taking a fourth of the dose.  She remains on Oxtellar 300 mg at bedtime.  She takes gabapentin 300 mg  4-5 day as needed.  She has returned to work.  She still has some mild right upper eyelid swelling.  She no longer has the, right eye tearing, she denies loss of vision and has been told by ophthalmology that she may have some scarring but her vision has not been affected.  She returns for reevaluation UPDATE 2/25/2020CM Kristin Ramsey, 58 year old female returns for follow-up with a history of postherpetic neuralgia.  She continues to have some discomfort of the forehead at intervals.  She remains on Oxtellar 300 at bedtime she also takes gabapentin 600 mg 3 times a day, she continues to have some mild right upper eyelid swelling.  She sees her ophthalmologist on a routine basis her vision has not been significantly affected however she does have some scarring.  She continues to work full-time.  She denies side effects to  her medications.  She returns for reevaluation REVIEW OF SYSTEMS: Full 14 system review of systems performed and notable only for those listed, all others are neg:  Constitutional: neg  Cardiovascular: neg Ear/Nose/Throat: neg  Skin: neg Eyes: Itching Respiratory: neg Gastroitestinal: neg  Hematology/Lymphatic: neg  Endocrine:  neg Musculoskeletal:neg Allergy/Immunology: neg Neurological: Numbness in the face  Psychiatric: neg Sleep : neg   ALLERGIES: Allergies  Allergen Reactions  . Erythromycin   . Latex Itching  . Sulfamethoxazole-Trimethoprim     HOME MEDICATIONS: Outpatient Medications Prior to Visit  Medication Sig Dispense Refill  . escitalopram (LEXAPRO) 10 MG tablet     . gabapentin (NEURONTIN) 300 MG capsule Take 2 capsules (600 mg total) by mouth 3 (three) times daily. 540 capsule 3  . hydrochlorothiazide (HYDRODIURIL) 12.5 MG tablet TK 1 T PO QAM  2  . metoprolol succinate (TOPROL-XL) 50 MG 24 hr tablet TK 1 AND 1/2 TS PO QD  0  . OXTELLAR XR 300 MG TB24 TAKE 1 TABLET BY MOUTH AT BEDTIME 30 tablet 2  . clonazePAM (KLONOPIN) 0.5 MG tablet Take one tablet daily as needed. 30 tablet 2  . esomeprazole (NEXIUM) 20 MG capsule Take 20 mg by mouth daily at 12 noon.     No facility-administered medications prior to visit.     PAST MEDICAL HISTORY: Past Medical History:  Diagnosis Date  . Anxiety   . Hypertension   . Interstitial cystitis   . Shingles     PAST SURGICAL HISTORY: Past Surgical History:  Procedure Laterality Date  . ABDOMINAL HYSTERECTOMY    . HYSTEROSCOPY    . TONSILLECTOMY      FAMILY HISTORY: Family History  Problem Relation Age of Onset  . Crohn's disease Mother   . Hypertension Father   . Anxiety disorder Father   . Macular degeneration Father   . Prostate cancer Father     SOCIAL HISTORY: Social History   Socioeconomic History  . Marital status: Married    Spouse name: Not on file  . Number of children: 1  . Years of education: 47  . Highest education level: Bachelor's degree (e.g., BA, AB, BS)  Occupational History  . 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 Financial resource strain: Not on file  . Food insecurity:    Worry: Not on file    Inability: Not on file  . Transportation needs:    Medical: Not on file    Non-medical: Not on file   Tobacco Use  . Smoking status: Never Smoker  . Smokeless tobacco: Never Used  Substance and Sexual Activity  . Alcohol use: No    Frequency: Never  . Drug use: No  . Sexual activity: Not on file  Lifestyle  . Physical activity:    Days per week: Not on file    Minutes per session: Not on file  . Stress: Not on file  Relationships  . Social connections:    Talks on phone: Not on file    Gets together: Not on file    Attends religious service: Not on file    Active member of club or organization: Not on file    Attends meetings of clubs or organizations: Not on file    Relationship status: Not on file  . Intimate partner violence:    Fear of current or ex partner: Not on file    Emotionally abused: Not on file    Physically abused: Not on file  Forced sexual activity: Not on file  Other Topics Concern  . Not on file  Social History Narrative   Lives at home with husband.   Right-handed.   0.5 cup of coffee per day, occasional soda.     PHYSICAL EXAM  Vitals:   06/14/18 1400  BP: 135/80  Pulse: 60  Weight: 166 lb 3.2 oz (75.4 kg)  Height: 5\' 7"  (1.702 m)   Body mass index is 26.03 kg/m.  Generalized: Well developed, in no acute distress  Head: normocephalic and atraumatic,. Oropharynx benign  Neck: Supple,  Musculoskeletal: No deformity   Neurological examination   Mentation: Alert oriented to time, place, history taking. Attention span and concentration appropriate. Recent and remote memory intact.  Follows all commands speech and language fluent.   Cranial nerve II-XII: Pupils were equal round reactive to light extraocular movements were full, visual field were full on confrontational test. Facial sensation and strength were normal. hearing was intact to finger rubbing bilaterally. Uvula tongue midline. head turning and shoulder shrug were normal and symmetric.Tongue protrusion into cheek strength was normal. Motor: normal bulk and tone, full strength in  the BUE, BLE,  Sensory: Mild decreased light touch in V1 and V2 distribution Coordination: finger-nose-finger, heel-to-shin bilaterally, no dysmetria Reflexes: Brachioradialis 2/2, biceps 2/2, triceps 2/2, patellar 2/2, Achilles 2/2, plantar responses were flexor bilaterally. Gait and Station: Rising up from seated position without assistance, normal stance,  moderate stride, good arm swing, smooth turning, able to perform tiptoe, and heel walking without difficulty. Tandem gait is steady  DIAGNOSTIC DATA (LABS, IMAGING, TESTING) - I reviewed patient records, labs, notes, testing and imaging myself where available.  Lab Results  Component Value Date   WBC 4.3 (L) 05/21/2009   HGB 13.0 05/21/2009   HCT 39.1 05/21/2009   MCV 91.9 05/21/2009   PLT 189.0 05/21/2009      Component Value Date/Time   NA 141 05/21/2009 1224   K 4.2 05/21/2009 1224   CL 103 05/21/2009 1224   CO2 30 05/21/2009 1224   GLUCOSE 87 05/21/2009 1224   BUN 9 05/21/2009 1224   CREATININE 0.6 05/21/2009 1224   CALCIUM 9.2 05/21/2009 1224   PROT 7.5 05/21/2009 1224   ALBUMIN 4.3 05/21/2009 1224   AST 20 05/21/2009 1224   ALT 15 05/21/2009 1224   ALKPHOS 51 05/21/2009 1224   BILITOT 0.3 05/21/2009 1224   GFRNONAA 113.92 05/21/2009 1224   GFRAA 116 12/20/2007 0944    Lab Results  Component Value Date   VITAMINB12 290 05/21/2009   Lab Results  Component Value Date   TSH 0.79 05/21/2009      ASSESSMENT AND PLAN   Kristin Ramsey is a 58 y.o. female  with  Right herpes simplex virus infection, mainly involving right V1, some involvement in right V2,.              PLAN: Continue Oxtellar 300 mg at bedtime will refill Continue gabapentin 300 mg  2caps three times daily Patient says she had recent labs done at primary care office within normal limits.  I do not have access to that information. Follow-up in 1 year Call for worsening symptoms Dennie Bible, [facility], [facility], APRN  [facility] [address] [place], [place] [address] [phone]

## 2018-06-14 ENCOUNTER — Encounter: Payer: Self-pay | Admitting: Nurse Practitioner

## 2018-06-14 ENCOUNTER — Ambulatory Visit: Payer: 59 | Admitting: Nurse Practitioner

## 2018-06-14 ENCOUNTER — Telehealth: Payer: Self-pay | Admitting: *Deleted

## 2018-06-14 VITALS — BP 135/80 | HR 60 | Ht 67.0 in | Wt 166.2 lb

## 2018-06-14 DIAGNOSIS — B0229 Other postherpetic nervous system involvement: Secondary | ICD-10-CM

## 2018-06-14 MED ORDER — GABAPENTIN 300 MG PO CAPS: 600.0000 mg | ORAL_CAPSULE | Freq: Three times a day (TID) | ORAL | 3 refills | Status: DC

## 2018-06-14 MED ORDER — OXCARBAZEPINE ER 300 MG PO TB24: 1.0000 | ORAL_TABLET | Freq: Every day | ORAL | 11 refills | Status: DC

## 2018-06-14 NOTE — Patient Instructions (Signed)
Continue Oxtellar 300 mg at bedtime will refill Continue gabapentin 300 mg  2caps three times daily Follow-up in 1 year Call for worsening symptoms

## 2018-06-14 NOTE — Telephone Encounter (Signed)
Received fax for oxtellar XR 300mg  ER tablets requiring PA.  I called and spoke to pharmacy, [facility].  She does note that card on file for $0 copay still active.  They will use, disregard PA request.

## 2018-06-14 NOTE — Progress Notes (Signed)
I have reviewed and agreed above plan. 

## 2018-06-17 DIAGNOSIS — J101 Influenza due to other identified influenza virus with other respiratory manifestations: Secondary | ICD-10-CM | POA: Diagnosis not present

## 2018-06-18 ENCOUNTER — Other Ambulatory Visit: Payer: 59

## 2018-07-02 ENCOUNTER — Other Ambulatory Visit: Payer: Self-pay | Admitting: Neurology

## 2018-07-15 ENCOUNTER — Other Ambulatory Visit: Payer: Self-pay | Admitting: Neurology

## 2018-09-08 ENCOUNTER — Other Ambulatory Visit: Payer: Self-pay | Admitting: *Deleted

## 2018-09-08 MED ORDER — OXCARBAZEPINE ER 300 MG PO TB24: 1.0000 | ORAL_TABLET | Freq: Every day | ORAL | 11 refills | Status: DC

## 2018-10-17 DIAGNOSIS — I10 Essential (primary) hypertension: Secondary | ICD-10-CM | POA: Diagnosis not present

## 2018-10-17 DIAGNOSIS — R5383 Other fatigue: Secondary | ICD-10-CM | POA: Diagnosis not present

## 2018-10-17 DIAGNOSIS — R69 Illness, unspecified: Secondary | ICD-10-CM | POA: Diagnosis not present

## 2018-10-17 DIAGNOSIS — G43009 Migraine without aura, not intractable, without status migrainosus: Secondary | ICD-10-CM | POA: Diagnosis not present

## 2018-10-17 DIAGNOSIS — R079 Chest pain, unspecified: Secondary | ICD-10-CM | POA: Diagnosis not present

## 2018-10-17 DIAGNOSIS — B0229 Other postherpetic nervous system involvement: Secondary | ICD-10-CM | POA: Diagnosis not present

## 2018-10-17 DIAGNOSIS — E785 Hyperlipidemia, unspecified: Secondary | ICD-10-CM | POA: Diagnosis not present

## 2018-10-17 DIAGNOSIS — K5900 Constipation, unspecified: Secondary | ICD-10-CM | POA: Diagnosis not present

## 2018-11-01 ENCOUNTER — Ambulatory Visit: Payer: 59 | Admitting: Cardiology

## 2018-11-02 ENCOUNTER — Ambulatory Visit (INDEPENDENT_AMBULATORY_CARE_PROVIDER_SITE_OTHER): Payer: 59 | Admitting: Cardiology

## 2018-11-02 ENCOUNTER — Ambulatory Visit (HOSPITAL_BASED_OUTPATIENT_CLINIC_OR_DEPARTMENT_OTHER)
Admission: RE | Admit: 2018-11-02 | Discharge: 2018-11-02 | Disposition: A | Discharge status: Home / Self Care | Payer: 59 | Source: Ambulatory Visit | Attending: Cardiology | Admitting: Cardiology

## 2018-11-02 ENCOUNTER — Other Ambulatory Visit: Payer: Self-pay

## 2018-11-02 ENCOUNTER — Encounter: Payer: Self-pay | Admitting: Cardiology

## 2018-11-02 DIAGNOSIS — E782 Mixed hyperlipidemia: Secondary | ICD-10-CM | POA: Diagnosis not present

## 2018-11-02 DIAGNOSIS — I10 Essential (primary) hypertension: Secondary | ICD-10-CM | POA: Insufficient documentation

## 2018-11-02 DIAGNOSIS — I209 Angina pectoris, unspecified: Secondary | ICD-10-CM | POA: Diagnosis not present

## 2018-11-02 DIAGNOSIS — I200 Unstable angina: Secondary | ICD-10-CM | POA: Insufficient documentation

## 2018-11-02 DIAGNOSIS — Z01818 Encounter for other preprocedural examination: Secondary | ICD-10-CM | POA: Diagnosis not present

## 2018-11-02 MED ORDER — NITROGLYCERIN 0.4 MG SL SUBL: 0.4000 mg | SUBLINGUAL_TABLET | SUBLINGUAL | 3 refills | Status: DC | PRN

## 2018-11-02 NOTE — H&P (View-Only) (Signed)
Cardiology Office Note:    Date:  11/02/2018   ID:  BABITA AMAKER, DOB 1960/06/14, MRN [id number]  PCP:  Cari Caraway, MD  Cardiologist:  Jenean Lindau, MD   Referring MD: [facility]*    ASSESSMENT:    1. Angina pectoris ([place])   2. Essential hypertension   3. Mixed dyslipidemia    PLAN:    In order of problems listed above:  1. Angina pectoris: The patient's symptoms are very concerning.In view of the patient's symptoms, I discussed with the patient options for evaluation. Invasive and noninvasive options were given to the patient. I discussed stress testing and coronary angiography and left heart catheterization at length. Benefits, pros and cons of each approach were discussed at length. Patient had multiple questions which were answered to the patient's satisfaction. Patient opted for invasive evaluation and we will set up for coronary angiography and left heart catheterization. Further recommendations will be made based on the findings with coronary angiography. In the interim if the patient has any significant symptoms in hospital to the nearest emergency room. 2. Essential hypertension: Blood pressure stable 3. Mixed dyslipidemia: Managed by primary care physician. 4. Sublingual nitroglycerin prescription was sent, its protocol and 911 protocol explained and the patient vocalized understanding questions were answered to the patient's satisfaction 5. Follow-up appointment after coronary angiography.   Medication Adjustments/Labs and Tests Ordered: Current medicines are reviewed at length with the patient today.  Concerns regarding medicines are outlined above.  No orders of the defined types were placed in this encounter.  No orders of the defined types were placed in this encounter.    History of Present Illness:    ANASTYN AYARS is a 58 y.o. female who is being seen today for the evaluation of angina pectoris at the request of [facility]  M*.  Patient is a pleasant 58 year old female.  She has past medical history of essential hypertension and dyslipidemia.  She gives a very concerning history.  She mentions to me that she is under significant amount of stress at work trying to learn new skills.  She mentions to me that in the past 2 to 3 weeks when she tries to walk at certain level of walking she has chest tightness radiating to the arm.  This makes her nervous and she slows down and stops.  She feels better.  She has never used aspirin or nitroglycerin.  For this reason she was sent here.  At the time of my evaluation, the patient is alert awake oriented and in no distress.  Past Medical History:  Diagnosis Date  . Anxiety   . Hypertension   . Interstitial cystitis   . Shingles     Past Surgical History:  Procedure Laterality Date  . ABDOMINAL HYSTERECTOMY    . HYSTEROSCOPY    . TONSILLECTOMY      Current Medications: Current Meds  Medication Sig  . escitalopram (LEXAPRO) 10 MG tablet Take 10 mg by mouth daily.  . fluticasone (FLONASE) 50 MCG/ACT nasal spray Place 1 spray into both nostrils daily.  Marland Kitchen gabapentin (NEURONTIN) 300 MG capsule Take 2 capsules (600 mg total) by mouth 3 (three) times daily.  . hydrochlorothiazide (HYDRODIURIL) 12.5 MG tablet TK 1 T PO QAM  . metoprolol succinate (TOPROL-XL) 50 MG 24 hr tablet Take 50 mg by mouth daily.   . OXcarbazepine ER (OXTELLAR XR) 300 MG TB24 Take 1 tablet by mouth at bedtime.     Allergies:  Erythromycin, Latex, and Sulfamethoxazole-trimethoprim   Social History   Socioeconomic History  . Marital status: Married    Spouse name: Not on file  . Number of children: 1  . Years of education: 42  . Highest education level: Bachelor's degree (e.g., BA, AB, BS)  Occupational History  . 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 Financial resource strain: Not on file  . Food insecurity    Worry: Not on file    Inability: Not on file  . Transportation needs     Medical: Not on file    Non-medical: Not on file  Tobacco Use  . Smoking status: Never Smoker  . Smokeless tobacco: Never Used  Substance and Sexual Activity  . Alcohol use: No    Frequency: Never  . Drug use: No  . Sexual activity: Not on file  Lifestyle  . Physical activity    Days per week: Not on file    Minutes per session: Not on file  . Stress: Not on file  Relationships  . Social Herbalist on phone: Not on file    Gets together: Not on file    Attends religious service: Not on file    Active member of club or organization: Not on file    Attends meetings of clubs or organizations: Not on file    Relationship status: Not on file  Other Topics Concern  . Not on file  Social History Narrative   Lives at home with husband.   Right-handed.   0.5 cup of coffee per day, occasional soda.     Family History: The patient's family history includes Anxiety disorder in her father; Crohn's disease in her mother; Hypertension in her father; Macular degeneration in her father; Prostate cancer in her father.  ROS:   Please see the history of present illness.    All other systems reviewed and are negative.  EKGs/Labs/Other Studies Reviewed:    The following studies were reviewed today: EKG reveals sinus rhythm and nonspecific ST-T changes.   Recent Labs: No results found for requested labs within last 8760 hours.  Recent Lipid Panel No results found for: CHOL, TRIG, HDL, CHOLHDL, VLDL, LDLCALC, LDLDIRECT  Physical Exam:    VS:  BP 118/66 (BP Location: Left Arm, Patient Position: Sitting, Cuff Size: Normal)   Ht 5\' 7"  (1.702 m)   Wt 161 lb 12.8 oz (73.4 kg)   SpO2 98%   BMI 25.34 kg/m     Wt Readings from Last 3 Encounters:  11/02/18 161 lb 12.8 oz (73.4 kg)  06/14/18 166 lb 3.2 oz (75.4 kg)  09/07/17 150 lb 9.6 oz (68.3 kg)     GEN: Patient is in no acute distress HEENT: Normal NECK: No JVD; No carotid bruits LYMPHATICS: No lymphadenopathy  CARDIAC: S1 S2 regular, 2/6 systolic murmur at the apex. RESPIRATORY:  Clear to auscultation without rales, wheezing or rhonchi  ABDOMEN: Soft, non-tender, non-distended MUSCULOSKELETAL:  No edema; No deformity  SKIN: Warm and dry NEUROLOGIC:  Alert and oriented x 3 PSYCHIATRIC:  Normal affect    Signed, Jenean Lindau, MD  11/02/2018 2:48 PM    [place]

## 2018-11-02 NOTE — Addendum Note (Signed)
Addended by: Beckey Rutter on: 11/02/2018 04:08 PM   Modules accepted: Orders

## 2018-11-02 NOTE — Patient Instructions (Addendum)
Medication Instructions:  Your physician has recommended you make the following change in your medication:  START taking nitroglycerin as needed for chest pain When having chest pain, stop what you are doing and sit down. Take 1 nitro, wait 5 minutes. Still having chest pain, take 1 nitro, wait 5 minutes. Still having chest pain, take 1 nitro, dial 911. Total of 3 nitro in 15 minutes.  Start taking aspirin 81 mg (1 tablet) once daily  If you need a refill on your cardiac medications before your next appointment, please call your pharmacy.   Lab work: You will need to have a BMP and CBC drawn today.   If you have labs (blood work) drawn today and your tests are completely normal, you will receive your results only by:  [place] (if you have MyChart) OR  A paper copy in the mail If you have any lab test that is abnormal or we need to change your treatment, we will call you to review the results.  Testing/Procedures: YOU had an EKG performed today.  Your physician has requested that you have a cardiac catheterization. Cardiac catheterization is used to diagnose and/or treat various heart conditions. Doctors may recommend this procedure for a number of different reasons. The most common reason is to evaluate chest pain. Chest pain can be a symptom of coronary artery disease (CAD), and cardiac catheterization can show whether plaque is narrowing or blocking your hearts arteries. This procedure is also used to evaluate the valves, as well as measure the blood flow and oxygen levels in different parts of your heart. For further information please visit [email or web address]. Please follow instruction sheet, as given.     [place], [place] [address] Dept: [phone] Loc: [phone]  Kristin Ramsey  11/02/2018  You are scheduled for a Cardiac Catheterization on  Friday, August 7 with Dr. Harrell Gave End.  1. Please arrive at the [facility] (Main Entrance A) at [facility]: [place], [place] [address] at 7:00 AM (This time is two hours before your procedure to ensure your preparation). Free valet parking service is available.   Special note: Every effort is made to have your procedure done on time. Please understand that emergencies sometimes delay scheduled procedures.  2. Diet: Do not eat solid foods after midnight.  The patient may have clear liquids until 5am upon the day of the procedure.  3. Labs: done on 11/02/2018 COVID Screening schedule for 0940 on 11/21/18 at [address] [place], [place]   4. Medication instructions in preparation for your procedure:   Contrast Allergy: No   Stop taking, toprol Thursday, August 6,, HTCZ (Hydrochlorothiazide) Friday, August 7,      On the morning of your procedure, take your Aspirin and any morning medicines NOT listed above.  You may use sips of water.  5. Plan for one night stay--bring personal belongings. 6. Bring a current list of your medications and current insurance cards. 7. You MUST have a responsible person to drive you home. 8. Someone MUST be with you the first 24 hours after you arrive home or your discharge will be delayed. 9. Please wear clothes that are easy to get on and off and wear slip-on shoes.  Thank you for allowing [place] to care for you!   -- [facility]   Follow-Up: At [facility], you and your health  needs are our priority.  As part of our continuing mission to provide you with exceptional heart care, we have created designated Provider Care Teams.  These Care Teams include your primary Cardiologist (physician) and Advanced Practice Providers (APPs -  Physician Assistants and Nurse Practitioners) who all work together to provide you with the care you need, when you need it. You will need a follow up appointment  in 1 months.    Any Other Special Instructions Will Be Listed Below  Coronary Angiogram A coronary angiogram is an X-ray procedure that is used to examine the arteries in the heart. In this procedure, a dye (contrast dye) is injected through a long, thin tube (catheter). The catheter is inserted through the groin, wrist, or arm. The dye is injected into each artery, then X-rays are taken to show if there is a blockage in the arteries of the heart. This procedure can also show if you have valve disease or a disease of the aorta, and it can be used to check the overall function of your heart muscle. You may have a coronary angiogram if:  You are having chest pain, or other symptoms of angina, and you are at risk for heart disease.  You have an abnormal electrocardiogram (ECG) or stress test.  You have chest pain and heart failure.  You are having irregular heart rhythms.  You and your health care provider determine that the benefits of the test information outweigh the risks of the procedure. Let your health care provider know about:  Any allergies you have, including allergies to contrast dye.  All medicines you are taking, including vitamins, herbs, eye drops, creams, and over-the-counter medicines.  Any problems you or family members have had with anesthetic medicines.  Any blood disorders you have.  Any surgeries you have had.  History of kidney problems or kidney failure.  Any medical conditions you have.  Whether you are pregnant or may be pregnant. What are the risks? Generally, this is a safe procedure. However, problems may occur, including:  Infection.  Allergic reaction to medicines or dyes that are used.  Bleeding from the access site or other locations.  Kidney injury, especially in people with impaired kidney function.  Stroke (rare).  Heart attack (rare).  Damage to other structures or organs. What happens before the procedure? Staying hydrated Follow  instructions from your health care provider about hydration, which may include:  Up to 2 hours before the procedure - you may continue to drink clear liquids, such as water, clear fruit juice, black coffee, and plain tea. Eating and drinking restrictions Follow instructions from your health care provider about eating and drinking, which may include:  8 hours before the procedure - stop eating heavy meals or foods such as meat, fried foods, or fatty foods.  6 hours before the procedure - stop eating light meals or foods, such as toast or cereal.  2 hours before the procedure - stop drinking clear liquids. General instructions  Ask your health care provider about: ? Changing or stopping your regular medicines. This is especially important if you are taking diabetes medicines or blood thinners. ? Taking medicines such as ibuprofen. These medicines can thin your blood. Do not take these medicines before your procedure if your health care provider instructs you not to, though aspirin may be recommended prior to coronary angiograms.  Plan to have someone take you home from the hospital or clinic.  You may need to have blood tests or X-rays done.  What happens during the procedure?  An IV tube will be inserted into one of your veins.  You will be given one or more of the following: ? A medicine to help you relax (sedative). ? A medicine to numb the area where the catheter will be inserted into an artery (local anesthetic).  To reduce your risk of infection: ? Your health care team will wash or sanitize their hands. ? Your skin will be washed with soap. ? Hair may be removed from the area where the catheter will be inserted.  You will be connected to a continuous ECG monitor.  The catheter will be inserted into an artery. The location may be in your groin, in your wrist, or in the fold of your arm (near your elbow).  A type of X-ray (fluoroscopy) will be used to help guide the catheter to  the opening of the blood vessel that is being examined.  A dye will be injected into the catheter, and X-rays will be taken. The dye will help to show where any narrowing or blockages are located in the heart arteries.  Tell your health care provider if you have any chest pain or trouble breathing during the procedure.  If blockages are found, your health care provider may perform another procedure, such as inserting a coronary stent. The procedure may vary among health care providers and hospitals. What happens after the procedure?  After the procedure, you will need to keep the area still for a few hours, or for as long as told by your health care provider. If the procedure is done through the groin, you will be instructed to not bend and not cross your legs.  The insertion site will be checked frequently.  The pulse in your foot or wrist will be checked frequently.  You may have additional blood tests, X-rays, and a test that records the electrical activity of your heart (ECG).  Do not drive for 24 hours if you were given a sedative. Summary  A coronary angiogram is an X-ray procedure that is used to look into the arteries in the heart.  During the procedure, a dye (contrast dye) is injected through a long, thin tube (catheter). The catheter is inserted through the groin, wrist, or arm.  Tell your health care provider about any allergies you have, including allergies to contrast dye.  After the procedure, you will need to keep the area still for a few hours, or for as long as told by your health care provider. This information is not intended to replace advice given to you by your health care provider. Make sure you discuss any questions you have with your health care provider. Document Released: 10/11/2002 Document Revised: 03/19/2017 Document Reviewed: 01/17/2016 Elsevier Patient Education  [place].   Aspirin and Your Heart  Aspirin is a medicine that prevents the  cells in the blood that are used for clotting, called platelets, from sticking together. Aspirin can be used to help reduce the risk of blood clots, heart attacks, and other heart-related problems. Can I take aspirin? Your health care provider will help you determine whether it is safe and beneficial for you to take aspirin daily. Taking aspirin daily may be helpful if you:  Have had a heart attack or chest pain.  Are at risk for a heart attack.  Have undergone open-heart surgery, such as coronary artery bypass surgery (CABG).  Have had coronary angioplasty or a stent.  Have had certain types of stroke or  transient ischemic attack (TIA).  Have peripheral artery disease (PAD).  Have chronic heart rhythm problems such as atrial fibrillation and cannot take an anticoagulant.  Have valve disease or have had surgery on a valve. What are the risks? Daily use of aspirin can cause side effects. Some of these include:  Bleeding. Bleeding problems can be minor or serious. An example of a minor problem is a cut that does not stop bleeding. An example of a more serious problem is stomach bleeding or, rarely, bleeding into the brain. Your risk of bleeding is increased if you are also taking non-steroidal anti-inflammatory drugs (NSAIDs).  Increased bruising.  Upset stomach.  An allergic reaction. People who have nasal polyps have an increased risk of developing an aspirin allergy. General guidelines  Take aspirin only as told by your health care provider. Make sure that you understand how much you should take and what form you should take. The two forms of aspirin are: ? Non-enteric-coated.This type of aspirin does not have a coating and is absorbed quickly. This type of aspirin also comes in a chewable form. ? Enteric-coated. This type of aspirin has a coating that releases the medicine very slowly. Enteric-coated aspirin might cause less stomach upset than non-enteric-coated aspirin. This type  of aspirin should not be chewed or crushed.  Limit alcohol intake to no more than 1 drink a day for nonpregnant women and 2 drinks a day for men. Drinking alcohol increases your risk of bleeding. One drink equals 12 oz of beer, 5 oz of wine, or 1 oz of hard liquor. Contact a health care provider if you:  Have unusual bleeding or bruising.  Have stomach pain or nausea.  Have ringing in your ears.  Have an allergic reaction that causes: ? Hives. ? Itchy skin. ? Swelling of the lips, tongue, or face. Get help right away if you:  Notice that your bowel movements are bloody, dark red, or black in color.  Vomit or cough up blood.  Have blood in your urine.  Cough, have noisy breathing (wheeze), or feel short of breath.  Have chest pain, especially if the pain spreads to the arms, back, neck, or jaw.  Have a severe headache, or a headache with confusion, or dizziness. These symptoms may represent a serious problem that is an emergency. Do not wait to see if the symptoms will go away. Get medical help right away. Call your local emergency services (911 in the [place] Do not drive yourself to the hospital. Summary  Aspirin can be used to help reduce the risk of blood clots, heart attacks, and other heart-related problems.  Daily use of aspirin can increase your risk of side effects. Your health care provider will help you determine whether it is safe and beneficial for you to take aspirin daily.  Take aspirin only as told by your health care provider. Make sure that you understand how much you can take and what form you can take. This information is not intended to replace advice given to you by your health care provider. Make sure you discuss any questions you have with your health care provider. Document Released: 03/19/2008 Document Revised: 02/04/2017 Document Reviewed: 02/04/2017 Elsevier Patient Education  2020 [place]. Nitroglycerin sublingual tablets What is this  medicine? NITROGLYCERIN (nye troe GLI ser in) is a type of vasodilator. It relaxes blood vessels, increasing the blood and oxygen supply to your heart. This medicine is used to relieve chest pain caused by angina. It is also  used to prevent chest pain before activities like climbing stairs, going outdoors in cold weather, or sexual activity. This medicine may be used for other purposes; ask your health care provider or pharmacist if you have questions. COMMON BRAND NAME(S): Nitroquick, Nitrostat, Nitrotab What should I tell my health care provider before I take this medicine? They need to know if you have any of these conditions:  anemia  head injury, recent stroke, or bleeding in the brain  liver disease  previous heart attack  an unusual or allergic reaction to nitroglycerin, other medicines, foods, dyes, or preservatives  pregnant or trying to get pregnant  breast-feeding How should I use this medicine? Take this medicine by mouth as needed. At the first sign of an angina attack (chest pain or tightness) place one tablet under your tongue. You can also take this medicine 5 to 10 minutes before an event likely to produce chest pain. Follow the directions on the prescription label. Let the tablet dissolve under the tongue. Do not swallow whole. Replace the dose if you accidentally swallow it. It will help if your mouth is not dry. Saliva around the tablet will help it to dissolve more quickly. Do not eat or drink, smoke or chew tobacco while a tablet is dissolving. If you are not better within 5 minutes after taking ONE dose of nitroglycerin, call 9-1-1 immediately to seek emergency medical care. Do not take more than 3 nitroglycerin tablets over 15 minutes. If you take this medicine often to relieve symptoms of angina, your doctor or health care professional may provide you with different instructions to manage your symptoms. If symptoms do not go away after following these instructions, it  is important to call 9-1-1 immediately. Do not take more than 3 nitroglycerin tablets over 15 minutes. Talk to your pediatrician regarding the use of this medicine in children. Special care may be needed. Overdosage: If you think you have taken too much of this medicine contact a poison control center or emergency room at once. NOTE: This medicine is only for you. Do not share this medicine with others. What if I miss a dose? This does not apply. This medicine is only used as needed. What may interact with this medicine? Do not take this medicine with any of the following medications:  certain migraine medicines like ergotamine and dihydroergotamine (DHE)  medicines used to treat erectile dysfunction like sildenafil, tadalafil, and vardenafil  riociguat This medicine may also interact with the following medications:  alteplase  aspirin  heparin  medicines for high blood pressure  medicines for mental depression  other medicines used to treat angina  phenothiazines like chlorpromazine, mesoridazine, prochlorperazine, thioridazine This list may not describe all possible interactions. Give your health care provider a list of all the medicines, herbs, non-prescription drugs, or dietary supplements you use. Also tell them if you smoke, drink alcohol, or use illegal drugs. Some items may interact with your medicine. What should I watch for while using this medicine? Tell your doctor or health care professional if you feel your medicine is no longer working. Keep this medicine with you at all times. Sit or lie down when you take your medicine to prevent falling if you feel dizzy or faint after using it. Try to remain calm. This will help you to feel better faster. If you feel dizzy, take several deep breaths and lie down with your feet propped up, or bend forward with your head resting between your knees. You may get drowsy or  dizzy. Do not drive, use machinery, or do anything that needs  mental alertness until you know how this drug affects you. Do not stand or sit up quickly, especially if you are an older patient. This reduces the risk of dizzy or fainting spells. Alcohol can make you more drowsy and dizzy. Avoid alcoholic drinks. Do not treat yourself for coughs, colds, or pain while you are taking this medicine without asking your doctor or health care professional for advice. Some ingredients may increase your blood pressure. What side effects may I notice from receiving this medicine? Side effects that you should report to your doctor or health care professional as soon as possible:  blurred vision  dry mouth  skin rash  sweating  the feeling of extreme pressure in the head  unusually weak or tired Side effects that usually do not require medical attention (report to your doctor or health care professional if they continue or are bothersome):  flushing of the face or neck  headache  irregular heartbeat, palpitations  nausea, vomiting This list may not describe all possible side effects. Call your doctor for medical advice about side effects. You may report side effects to FDA at 1-800-FDA-1088. Where should I keep my medicine? Keep out of the reach of children. Store at room temperature between 20 and 25 degrees C (68 and 77 degrees F). Store in [employer]. Protect from light and moisture. Keep tightly closed. Throw away any unused medicine after the expiration date. NOTE: This sheet is a summary. It may not cover all possible information. If you have questions about this medicine, talk to your doctor, pharmacist, or health care provider.  2020 Elsevier/Gold Standard (2013-02-02 17:57:36)

## 2018-11-02 NOTE — Progress Notes (Signed)
Cardiology Office Note:    Date:  11/02/2018   ID:  Kristin Ramsey, DOB 1961/02/04, MRN [id number]  PCP:  Cari Caraway, MD  Cardiologist:  Jenean Lindau, MD   Referring MD: [facility]*    ASSESSMENT:    1. Angina pectoris ([place])   2. Essential hypertension   3. Mixed dyslipidemia    PLAN:    In order of problems listed above:  1. Angina pectoris: The patient's symptoms are very concerning.In view of the patient's symptoms, I discussed with the patient options for evaluation. Invasive and noninvasive options were given to the patient. I discussed stress testing and coronary angiography and left heart catheterization at length. Benefits, pros and cons of each approach were discussed at length. Patient had multiple questions which were answered to the patient's satisfaction. Patient opted for invasive evaluation and we will set up for coronary angiography and left heart catheterization. Further recommendations will be made based on the findings with coronary angiography. In the interim if the patient has any significant symptoms in hospital to the nearest emergency room. 2. Essential hypertension: Blood pressure stable 3. Mixed dyslipidemia: Managed by primary care physician. 4. Sublingual nitroglycerin prescription was sent, its protocol and 911 protocol explained and the patient vocalized understanding questions were answered to the patient's satisfaction 5. Follow-up appointment after coronary angiography.   Medication Adjustments/Labs and Tests Ordered: Current medicines are reviewed at length with the patient today.  Concerns regarding medicines are outlined above.  No orders of the defined types were placed in this encounter.  No orders of the defined types were placed in this encounter.    History of Present Illness:    Kristin Ramsey is a 58 y.o. female who is being seen today for the evaluation of angina pectoris at the request of [facility]  [facility]*.  Patient is a pleasant 58 year old female.  She has past medical history of essential hypertension and dyslipidemia.  She gives a very concerning history.  She mentions to me that she is under significant amount of stress at work trying to learn new skills.  She mentions to me that in the past 2 to 3 weeks when she tries to walk at certain level of walking she has chest tightness radiating to the arm.  This makes her nervous and she slows down and stops.  She feels better.  She has never used aspirin or nitroglycerin.  For this reason she was sent here.  At the time of my evaluation, the patient is alert awake oriented and in no distress.  Past Medical History:  Diagnosis Date  . Anxiety   . Hypertension   . Interstitial cystitis   . Shingles     Past Surgical History:  Procedure Laterality Date  . ABDOMINAL HYSTERECTOMY    . HYSTEROSCOPY    . TONSILLECTOMY      Current Medications: Current Meds  Medication Sig  . escitalopram (LEXAPRO) 10 MG tablet Take 10 mg by mouth daily.  . fluticasone (FLONASE) 50 MCG/ACT nasal spray Place 1 spray into both nostrils daily.  Marland Kitchen gabapentin (NEURONTIN) 300 MG capsule Take 2 capsules (600 mg total) by mouth 3 (three) times daily.  . hydrochlorothiazide (HYDRODIURIL) 12.5 MG tablet TK 1 T PO QAM  . metoprolol succinate (TOPROL-XL) 50 MG 24 hr tablet Take 50 mg by mouth daily.   . OXcarbazepine ER (OXTELLAR XR) 300 MG TB24 Take 1 tablet by mouth at bedtime.     Allergies:  Erythromycin, Latex, and Sulfamethoxazole-trimethoprim   Social History   Socioeconomic History  . Marital status: Married    Spouse name: Not on file  . Number of children: 1  . Years of education: 37  . Highest education level: Bachelor's degree (e.g., BA, AB, BS)  Occupational History  . 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 Financial resource strain: Not on file  . Food insecurity    Worry: Not on file    Inability: Not on file  . Transportation needs     Medical: Not on file    Non-medical: Not on file  Tobacco Use  . Smoking status: Never Smoker  . Smokeless tobacco: Never Used  Substance and Sexual Activity  . Alcohol use: No    Frequency: Never  . Drug use: No  . Sexual activity: Not on file  Lifestyle  . Physical activity    Days per week: Not on file    Minutes per session: Not on file  . Stress: Not on file  Relationships  . Social Herbalist on phone: Not on file    Gets together: Not on file    Attends religious service: Not on file    Active member of club or organization: Not on file    Attends meetings of clubs or organizations: Not on file    Relationship status: Not on file  Other Topics Concern  . Not on file  Social History Narrative   Lives at home with husband.   Right-handed.   0.5 cup of coffee per day, occasional soda.     Family History: The patient's family history includes Anxiety disorder in her father; Crohn's disease in her mother; Hypertension in her father; Macular degeneration in her father; Prostate cancer in her father.  ROS:   Please see the history of present illness.    All other systems reviewed and are negative.  EKGs/Labs/Other Studies Reviewed:    The following studies were reviewed today: EKG reveals sinus rhythm and nonspecific ST-T changes.   Recent Labs: No results found for requested labs within last 8760 hours.  Recent Lipid Panel No results found for: CHOL, TRIG, HDL, CHOLHDL, VLDL, LDLCALC, LDLDIRECT  Physical Exam:    VS:  BP 118/66 (BP Location: Left Arm, Patient Position: Sitting, Cuff Size: Normal)   Ht 5\' 7"  (1.702 m)   Wt 161 lb 12.8 oz (73.4 kg)   SpO2 98%   BMI 25.34 kg/m     Wt Readings from Last 3 Encounters:  11/02/18 161 lb 12.8 oz (73.4 kg)  06/14/18 166 lb 3.2 oz (75.4 kg)  09/07/17 150 lb 9.6 oz (68.3 kg)     GEN: Patient is in no acute distress HEENT: Normal NECK: No JVD; No carotid bruits LYMPHATICS: No lymphadenopathy  CARDIAC: S1 S2 regular, 2/6 systolic murmur at the apex. RESPIRATORY:  Clear to auscultation without rales, wheezing or rhonchi  ABDOMEN: Soft, non-tender, non-distended MUSCULOSKELETAL:  No edema; No deformity  SKIN: Warm and dry NEUROLOGIC:  Alert and oriented x 3 PSYCHIATRIC:  Normal affect    Signed, Jenean Lindau, MD  11/02/2018 2:48 PM    [place]

## 2018-11-03 ENCOUNTER — Telehealth: Payer: Self-pay

## 2018-11-03 LAB — BASIC METABOLIC PANEL
BUN/Creatinine Ratio: 16 (ref 9–23)
BUN: 12 mg/dL (ref 6–24)
CO2: 25 mmol/L (ref 20–29)
Calcium: 9 mg/dL (ref 8.7–10.2)
Chloride: 97 mmol/L (ref 96–106)
Creatinine, Ser: 0.77 mg/dL (ref 0.57–1.00)
GFR calc Af Amer: 100 mL/min/{1.73_m2} (ref >59)
GFR calc non Af Amer: 87 mL/min/{1.73_m2} (ref >59)
Glucose: 84 mg/dL (ref 65–99)
Potassium: 4 mmol/L (ref 3.5–5.2)
Sodium: 138 mmol/L (ref 134–144)

## 2018-11-03 LAB — CBC
Hematocrit: 37 % (ref 34.0–46.6)
Hemoglobin: 12.8 g/dL (ref 11.1–15.9)
MCH: 30.2 pg (ref 26.6–33.0)
MCHC: 34.6 g/dL (ref 31.5–35.7)
MCV: 87 fL (ref 79–97)
Platelets: 210 10*3/uL (ref 150–450)
RBC: 4.24 x10E6/uL (ref 3.77–5.28)
RDW: 12.4 % (ref 11.7–15.4)
WBC: 5.6 10*3/uL (ref 3.4–10.8)

## 2018-11-03 NOTE — Telephone Encounter (Signed)
Patient called and notified of lab and test results.

## 2018-11-03 NOTE — Telephone Encounter (Signed)
-----   Message from Jenean Lindau, MD sent at 11/03/2018  8:08 AM EDT ----- The results of the study is unremarkable. Please inform patient. I will discuss in detail at next appointment. Cc  primary care/referring physician Jenean Lindau, MD 11/03/2018 8:08 AM

## 2018-11-15 ENCOUNTER — Telehealth: Payer: Self-pay | Admitting: Cardiology

## 2018-11-15 NOTE — Telephone Encounter (Signed)
Virtual Visit Pre-Appointment Phone Call  "(Name), I am calling you today to discuss your upcoming appointment. We are currently trying to limit exposure to the virus that causes COVID-19 by seeing patients at home rather than in the office."  1. "What is the BEST phone number to call the day of the visit?" - include this in appointment notes  2. Do you have or have access to (through a family member/friend) a smartphone with video capability that we can use for your visit?" a. If yes - list this number in appt notes as cell (if different from BEST phone #) and list the appointment type as a VIDEO visit in appointment notes b. If no - list the appointment type as a PHONE visit in appointment notes  3. Confirm consent - "In the setting of the current Covid19 crisis, you are scheduled for a (phone or video) visit with your provider on (date) at (time).  Just as we do with many in-office visits, in order for you to participate in this visit, we must obtain consent.  If you'd like, I can send this to your mychart (if signed up) or email for you to review.  Otherwise, I can obtain your verbal consent now.  All virtual visits are billed to your insurance company just like a normal visit would be.  By agreeing to a virtual visit, we'd like you to understand that the technology does not allow for your provider to perform an examination, and thus may limit your provider's ability to fully assess your condition. If your provider identifies any concerns that need to be evaluated in person, we will make arrangements to do so.  Finally, though the technology is pretty good, we cannot assure that it will always work on either your or our end, and in the setting of a video visit, we may have to convert it to a phone-only visit.  In either situation, we cannot ensure that we have a secure connection.  Are you willing to proceed?" STAFF: Did the patient verbally acknowledge consent to telehealth visit? Document  YES/NO here: YES  4. Advise patient to be prepared - "Two hours prior to your appointment, go ahead and check your blood pressure, pulse, oxygen saturation, and your weight (if you have the equipment to check those) and write them all down. When your visit starts, your provider will ask you for this information. If you have an Apple Watch or Kardia device, please plan to have heart rate information ready on the day of your appointment. Please have a pen and paper handy nearby the day of the visit as well."  5. Give patient instructions for MyChart download to smartphone OR Doximity/Doxy.me as below if video visit (depending on what platform provider is using)  6. Inform patient they will receive a phone call 15 minutes prior to their appointment time (may be from unknown caller ID) so they should be prepared to answer    TELEPHONE CALL NOTE  Kristin Ramsey has been deemed a candidate for a follow-up tele-health visit to limit community exposure during the Covid-19 pandemic. I spoke with the patient via phone to ensure availability of phone/video source, confirm preferred email & phone number, and discuss instructions and expectations.  I reminded Kristin Ramsey to be prepared with any vital sign and/or heart rhythm information that could potentially be obtained via home monitoring, at the time of her visit. I reminded Kristin Ramsey to expect a phone call prior to  her visit.  Frederic Jericho 11/15/2018 10:58 AM   INSTRUCTIONS FOR DOWNLOADING THE MYCHART APP TO SMARTPHONE  - The patient must first make sure to have activated MyChart and know their login information - If Apple, go to [employer] and type in MyChart in the search bar and download the app. If Android, ask patient to go to [employer] and type in [place] in the search bar and download the app. The app is free but as with any other app downloads, their phone may require them to verify saved payment information or Apple/Android  password.  - The patient will need to then log into the app with their MyChart username and password, and select [facility] as their healthcare provider to link the account. When it is time for your visit, go to the MyChart app, find appointments, and click Begin Video Visit. Be sure to Select Allow for your device to access the Microphone and Camera for your visit. You will then be connected, and your provider will be with you shortly.  **If they have any issues connecting, or need assistance please contact MyChart service desk [phone])**  **If using a computer, in order to ensure the best quality for their visit they will need to use either of the following Internet Browsers: [employer], or Google Chrome**  IF USING DOXIMITY or DOXY.ME - The patient will receive a link just prior to their visit by text.     FULL LENGTH CONSENT FOR TELE-HEALTH VISIT   I hereby voluntarily request, consent and authorize [facility] and its employed or contracted physicians, physician assistants, nurse practitioners or other licensed health care professionals (the Practitioner), to provide me with telemedicine health care services (the Services") as deemed necessary by the treating Practitioner. I acknowledge and consent to receive the Services by the Practitioner via telemedicine. I understand that the telemedicine visit will involve communicating with the Practitioner through live audiovisual communication technology and the disclosure of certain medical information by electronic transmission. I acknowledge that I have been given the opportunity to request an in-person assessment or other available alternative prior to the telemedicine visit and am voluntarily participating in the telemedicine visit.  I understand that I have the right to withhold or withdraw my consent to the use of telemedicine in the course of my care at any time, without affecting my right to future care or treatment,  and that the Practitioner or I may terminate the telemedicine visit at any time. I understand that I have the right to inspect all information obtained and/or recorded in the course of the telemedicine visit and may receive copies of available information for a reasonable fee.  I understand that some of the potential risks of receiving the Services via telemedicine include:   Delay or interruption in medical evaluation due to technological equipment failure or disruption;  Information transmitted may not be sufficient (e.g. poor resolution of images) to allow for appropriate medical decision making by the Practitioner; and/or   In rare instances, security protocols could fail, causing a breach of personal health information.  Furthermore, I acknowledge that it is my responsibility to provide information about my medical history, conditions and care that is complete and accurate to the best of my ability. I acknowledge that Practitioner's advice, recommendations, and/or decision may be based on factors not within their control, such as incomplete or inaccurate data provided by me or distortions of diagnostic images or specimens that may result from electronic transmissions. I  understand that the practice of medicine is not an exact science and that Practitioner makes no warranties or guarantees regarding treatment outcomes. I acknowledge that I will receive a copy of this consent concurrently upon execution via email to the email address I last provided but may also request a printed copy by calling the office of [facility].    I understand that my insurance will be billed for this visit.   I have read or had this consent read to me.  I understand the contents of this consent, which adequately explains the benefits and risks of the Services being provided via telemedicine.   I have been provided ample opportunity to ask questions regarding this consent and the Services and have had my questions  answered to my satisfaction.  I give my informed consent for the services to be provided through the use of telemedicine in my medical care  By participating in this telemedicine visit I agree to the above.

## 2018-11-21 ENCOUNTER — Other Ambulatory Visit (HOSPITAL_COMMUNITY)
Admission: RE | Admit: 2018-11-21 | Discharge: 2018-11-21 | Disposition: A | Discharge status: Home / Self Care | Payer: 59 | Source: Ambulatory Visit | Attending: Internal Medicine | Admitting: Internal Medicine

## 2018-11-21 DIAGNOSIS — Z01812 Encounter for preprocedural laboratory examination: Secondary | ICD-10-CM | POA: Diagnosis not present

## 2018-11-21 DIAGNOSIS — Z20828 Contact with and (suspected) exposure to other viral communicable diseases: Secondary | ICD-10-CM | POA: Insufficient documentation

## 2018-11-21 LAB — SARS CORONAVIRUS 2 (TAT 6-24 HRS): SARS Coronavirus 2: NEGATIVE

## 2018-11-24 ENCOUNTER — Telehealth: Payer: Self-pay | Admitting: *Deleted

## 2018-11-24 ENCOUNTER — Telehealth: Payer: Self-pay

## 2018-11-24 NOTE — Telephone Encounter (Signed)
Pt contacted pre-catheterization scheduled at [facility] for: Friday November 25, 2018 9 AM Verified arrival time and place: [facility]) at: 7 AM   No solid food after midnight prior to cath, clear liquids until 5 AM day of procedure. Contrast allergy: no  Hold: HCTZ-AM of procedure  Except hold medications AM meds can be  taken pre-cath with sip of water including: ASA 81 mg   Confirmed patient has responsible person to drive home post procedure and observe 24 hours after arriving home: yes  Due to Covid-19 pandemic, only one support person will be allowed with patient. Must be the same support person for that patient's entire stay, will be screened and required to wear a mask.   Patients are required to wear a mask when they enter the hospital.      COVID-19 Pre-Screening Questions:  . In the past 7 to 10 days have you had a cough,  shortness of breath, headache, congestion, fever (100 or greater) body aches, chills, sore throat, or sudden loss of taste or sense of smell? no . Have you been around anyone with known Covid 19? no . Have you been around anyone who is awaiting Covid 19 test results in the past 7 to 10 days? no . Have you been around anyone who has been exposed to Covid 19, or has mentioned symptoms of Covid 19 within the past 7 to 10 days? no    I reviewed procedure/mask/visitor, Covid-19 screening questions with patient, she verbalized understanding, thanked me for call.

## 2018-11-24 NOTE — Telephone Encounter (Signed)
Went over pre procedure medication with patient. No further questions.

## 2018-11-25 ENCOUNTER — Encounter (HOSPITAL_COMMUNITY): Payer: Self-pay | Admitting: Internal Medicine

## 2018-11-25 ENCOUNTER — Encounter (HOSPITAL_COMMUNITY)
Admission: RE | Disposition: A | Discharge status: Home / Self Care | Payer: 59 | Source: Home / Self Care | Attending: Internal Medicine

## 2018-11-25 ENCOUNTER — Other Ambulatory Visit: Payer: Self-pay

## 2018-11-25 ENCOUNTER — Ambulatory Visit (HOSPITAL_COMMUNITY)
Admission: RE | Admit: 2018-11-25 | Discharge: 2018-11-25 | Disposition: A | Discharge status: Home / Self Care | Payer: 59 | Attending: Internal Medicine | Admitting: Internal Medicine

## 2018-11-25 DIAGNOSIS — E782 Mixed hyperlipidemia: Secondary | ICD-10-CM | POA: Diagnosis not present

## 2018-11-25 DIAGNOSIS — Z79899 Other long term (current) drug therapy: Secondary | ICD-10-CM | POA: Diagnosis not present

## 2018-11-25 DIAGNOSIS — I200 Unstable angina: Secondary | ICD-10-CM | POA: Insufficient documentation

## 2018-11-25 DIAGNOSIS — Z881 Allergy status to other antibiotic agents status: Secondary | ICD-10-CM | POA: Diagnosis not present

## 2018-11-25 DIAGNOSIS — Z8249 Family history of ischemic heart disease and other diseases of the circulatory system: Secondary | ICD-10-CM | POA: Diagnosis not present

## 2018-11-25 DIAGNOSIS — R69 Illness, unspecified: Secondary | ICD-10-CM | POA: Diagnosis not present

## 2018-11-25 DIAGNOSIS — I10 Essential (primary) hypertension: Secondary | ICD-10-CM | POA: Diagnosis not present

## 2018-11-25 DIAGNOSIS — I209 Angina pectoris, unspecified: Secondary | ICD-10-CM

## 2018-11-25 DIAGNOSIS — F419 Anxiety disorder, unspecified: Secondary | ICD-10-CM | POA: Insufficient documentation

## 2018-11-25 HISTORY — PX: LEFT HEART CATH AND CORONARY ANGIOGRAPHY: CATH118249

## 2018-11-25 SURGERY — LEFT HEART CATH AND CORONARY ANGIOGRAPHY
Anesthesia: LOCAL

## 2018-11-25 MED ORDER — HEPARIN (PORCINE) IN NACL 1000-0.9 UT/500ML-% IV SOLN
INTRAVENOUS | Status: DC | PRN
  Administered 2018-11-25: 500 mL

## 2018-11-25 MED ORDER — SODIUM CHLORIDE 0.9% FLUSH: 3.0000 mL | Freq: Two times a day (BID) | INTRAVENOUS | Status: DC

## 2018-11-25 MED ORDER — FENTANYL CITRATE (PF) 100 MCG/2ML IJ SOLN
INTRAMUSCULAR | Status: DC | PRN
  Administered 2018-11-25: 25 ug via INTRAVENOUS

## 2018-11-25 MED ORDER — HEPARIN SODIUM (PORCINE) 1000 UNIT/ML IJ SOLN
INTRAMUSCULAR | Status: DC | PRN
  Administered 2018-11-25: 3500 [IU] via INTRAVENOUS

## 2018-11-25 MED ORDER — HEPARIN SODIUM (PORCINE) 1000 UNIT/ML IJ SOLN
INTRAMUSCULAR | Status: AC
  Filled 2018-11-25: qty 1

## 2018-11-25 MED ORDER — LIDOCAINE HCL (PF) 1 % IJ SOLN
INTRAMUSCULAR | Status: AC
  Filled 2018-11-25: qty 30

## 2018-11-25 MED ORDER — VERAPAMIL HCL 2.5 MG/ML IV SOLN
INTRAVENOUS | Status: AC
  Filled 2018-11-25: qty 2

## 2018-11-25 MED ORDER — ASPIRIN 81 MG PO CHEW: 81.0000 mg | CHEWABLE_TABLET | ORAL | Status: DC

## 2018-11-25 MED ORDER — IOHEXOL 350 MG/ML SOLN
INTRAVENOUS | Status: DC | PRN
  Administered 2018-11-25: 55 mL via INTRA_ARTERIAL

## 2018-11-25 MED ORDER — SODIUM CHLORIDE 0.9 % WEIGHT BASED INFUSION
3.0000 mL/kg/h | INTRAVENOUS | Status: AC
  Administered 2018-11-25: 3 mL/kg/h via INTRAVENOUS

## 2018-11-25 MED ORDER — MIDAZOLAM HCL 2 MG/2ML IJ SOLN
INTRAMUSCULAR | Status: DC | PRN
  Administered 2018-11-25: 1 mg via INTRAVENOUS

## 2018-11-25 MED ORDER — FENTANYL CITRATE (PF) 100 MCG/2ML IJ SOLN
INTRAMUSCULAR | Status: AC
  Filled 2018-11-25: qty 2

## 2018-11-25 MED ORDER — SODIUM CHLORIDE 0.9 % WEIGHT BASED INFUSION: 1.0000 mL/kg/h | INTRAVENOUS | Status: DC

## 2018-11-25 MED ORDER — VERAPAMIL HCL 2.5 MG/ML IV SOLN
INTRAVENOUS | Status: DC | PRN
  Administered 2018-11-25: 10 mL via INTRA_ARTERIAL

## 2018-11-25 MED ORDER — SODIUM CHLORIDE 0.9 % IV SOLN: 250.0000 mL | INTRAVENOUS | Status: DC | PRN

## 2018-11-25 MED ORDER — SODIUM CHLORIDE 0.9% FLUSH: 3.0000 mL | INTRAVENOUS | Status: DC | PRN

## 2018-11-25 MED ORDER — HEPARIN (PORCINE) IN NACL 1000-0.9 UT/500ML-% IV SOLN
INTRAVENOUS | Status: AC
  Filled 2018-11-25: qty 1000

## 2018-11-25 MED ORDER — ASPIRIN 81 MG PO CHEW
81.0000 mg | CHEWABLE_TABLET | ORAL | Status: AC
  Administered 2018-11-25: 81 mg via ORAL
  Filled 2018-11-25: qty 1

## 2018-11-25 MED ORDER — LIDOCAINE HCL (PF) 1 % IJ SOLN
INTRAMUSCULAR | Status: DC | PRN
  Administered 2018-11-25: 2 mL

## 2018-11-25 MED ORDER — MIDAZOLAM HCL 2 MG/2ML IJ SOLN
INTRAMUSCULAR | Status: AC
  Filled 2018-11-25: qty 2

## 2018-11-25 SURGICAL SUPPLY — 10 items
CATH 5FR JL3.5 JR4 ANG PIG MP (CATHETERS) ×1 IMPLANT
DEVICE RAD COMP TR BAND LRG (VASCULAR PRODUCTS) ×1 IMPLANT
GUIDEWIRE INQWIRE 1.5J.035X260 (WIRE) IMPLANT
INQWIRE 1.5J .035X260CM (WIRE) ×2
KIT HEART LEFT (KITS) ×2 IMPLANT
PACK CARDIAC CATHETERIZATION (CUSTOM PROCEDURE TRAY) ×2 IMPLANT
SHEATH RAIN RADIAL 21G 6FR (SHEATH) ×1 IMPLANT
SYR MEDRAD MARK 7 150ML (SYRINGE) ×2 IMPLANT
TRANSDUCER W/STOPCOCK (MISCELLANEOUS) ×2 IMPLANT
TUBING CIL FLEX 10 FLL-RA (TUBING) ×2 IMPLANT

## 2018-11-25 NOTE — Progress Notes (Signed)
D/c instructions reviewed with Elta Guadeloupe - husband via telephone

## 2018-11-25 NOTE — Interval H&P Note (Signed)
History and Physical Interval Note:  11/25/2018 8:30 AM  Kristin Ramsey  has presented today for cardiac catheterization, with the diagnosis of accelerating angina.  The various methods of treatment have been discussed with the patient and family. After consideration of risks, benefits and other options for treatment, the patient has consented to  Procedure(s): LEFT HEART CATH AND CORONARY ANGIOGRAPHY (N/A) as a surgical intervention.  The patient's history has been reviewed, patient examined, no change in status, stable for surgery.  I have reviewed the patient's chart and labs.  Questions were answered to the patient's satisfaction.    Cath Lab Visit (complete for each Cath Lab visit)  Clinical Evaluation Leading to the Procedure:   ACS: No.  Non-ACS:    Anginal Classification: CCS III  Anti-ischemic medical therapy: Minimal Therapy (1 class of medications)  Non-Invasive Test Results: No non-invasive testing performed  Prior CABG: No previous CABG  Kristin Ramsey

## 2018-11-25 NOTE — Discharge Instructions (Signed)
Radial Site Care ° °This sheet gives you information about how to care for yourself after your procedure. Your health care provider may also give you more specific instructions. If you have problems or questions, contact your health care provider. °What can I expect after the procedure? °After the procedure, it is common to have: °· Bruising and tenderness at the catheter insertion area. °Follow these instructions at home: °Medicines °· Take over-the-counter and prescription medicines only as told by your health care provider. °Insertion site care °· Follow instructions from your health care provider about how to take care of your insertion site. Make sure you: °? Wash your hands with soap and water before you change your bandage (dressing). If soap and water are not available, use hand sanitizer. °? Change your dressing as told by your health care provider. °? Leave stitches (sutures), skin glue, or adhesive strips in place. These skin closures may need to stay in place for 2 weeks or longer. If adhesive strip edges start to loosen and curl up, you may trim the loose edges. Do not remove adhesive strips completely unless your health care provider tells you to do that. °· Check your insertion site every day for signs of infection. Check for: °? Redness, swelling, or pain. °? Fluid or blood. °? Pus or a bad smell. °? Warmth. °· Do not take baths, swim, or use a hot tub until your health care provider approves. °· You may shower 24-48 hours after the procedure, or as directed by your health care provider. °? Remove the dressing and gently wash the site with plain soap and water. °? Pat the area dry with a clean towel. °? Do not rub the site. That could cause bleeding. °· Do not apply powder or lotion to the site. °Activity ° °· For 24 hours after the procedure, or as directed by your health care provider: °? Do not flex or bend the affected arm. °? Do not push or pull heavy objects with the affected arm. °? Do not  drive yourself home from the hospital or clinic. You may drive 24 hours after the procedure unless your health care provider tells you not to. °? Do not operate machinery or power tools. °· Do not lift anything that is heavier than 10 lb (4.5 kg), or the limit that you are told, until your health care provider says that it is safe. °· Ask your health care provider when it is okay to: °? Return to work or school. °? Resume usual physical activities or sports. °? Resume sexual activity. °General instructions °· If the catheter site starts to bleed, raise your arm and put firm pressure on the site. If the bleeding does not stop, get help right away. This is a medical emergency. °· If you went home on the same day as your procedure, a responsible adult should be with you for the first 24 hours after you arrive home. °· Keep all follow-up visits as told by your health care provider. This is important. °Contact a health care provider if: °· You have a fever. °· You have redness, swelling, or yellow drainage around your insertion site. °Get help right away if: °· You have unusual pain at the radial site. °· The catheter insertion area swells very fast. °· The insertion area is bleeding, and the bleeding does not stop when you hold steady pressure on the area. °· Your arm or hand becomes pale, cool, tingly, or numb. °These symptoms may represent a serious problem   that is an emergency. Do not wait to see if the symptoms will go away. Get medical help right away. Call your local emergency services (911 in the [place] Do not drive yourself to the hospital. °Summary °· After the procedure, it is common to have bruising and tenderness at the site. °· Follow instructions from your health care provider about how to take care of your radial site wound. Check the wound every day for signs of infection. °· Do not lift anything that is heavier than 10 lb (4.5 kg), or the limit that you are told, until your health care provider says  that it is safe. °This information is not intended to replace advice given to you by your health care provider. Make sure you discuss any questions you have with your health care provider. °Document Released: 05/09/2010 Document Revised: 05/12/2017 Document Reviewed: 05/12/2017 °Elsevier Patient Education © 2020 [employer] ° °

## 2018-12-19 ENCOUNTER — Encounter: Payer: Self-pay | Admitting: Cardiology

## 2018-12-19 ENCOUNTER — Telehealth (INDEPENDENT_AMBULATORY_CARE_PROVIDER_SITE_OTHER): Payer: 59 | Admitting: Cardiology

## 2018-12-19 ENCOUNTER — Other Ambulatory Visit: Payer: Self-pay

## 2018-12-19 VITALS — Ht 67.0 in | Wt 159.0 lb

## 2018-12-19 DIAGNOSIS — E782 Mixed hyperlipidemia: Secondary | ICD-10-CM | POA: Diagnosis not present

## 2018-12-19 DIAGNOSIS — I10 Essential (primary) hypertension: Secondary | ICD-10-CM | POA: Diagnosis not present

## 2018-12-19 NOTE — Patient Instructions (Signed)
Medication Instructions:  Your physician recommends that you continue on your current medications as directed. Please refer to the Current Medication list given to you today.  If you need a refill on your cardiac medications before your next appointment, please call your pharmacy.   Lab work: NONE If you have labs (blood work) drawn today and your tests are completely normal, you will receive your results only by: Marland Kitchen MyChart Message (if you have MyChart) OR . A paper copy in the mail If you have any lab test that is abnormal or we need to change your treatment, we will call you to review the results.  Testing/Procedures: NONE  Follow-Up: At [facility], you and your health needs are our priority.  As part of our continuing mission to provide you with exceptional heart care, we have created designated Provider Care Teams.  These Care Teams include your primary Cardiologist (physician) and Advanced Practice Providers (APPs -  Physician Assistants and Nurse Practitioners) who all work together to provide you with the care you need, when you need it. You will need a follow up appointment on an as needed bases. Please feel free to contact our office in the future.

## 2018-12-19 NOTE — Progress Notes (Signed)
Virtual Visit via Video Note   This visit type was conducted due to national recommendations for restrictions regarding the COVID-19 Pandemic (e.g. social distancing) in an effort to limit this patient's exposure and mitigate transmission in our community.  Due to her co-morbid illnesses, this patient is at least at moderate risk for complications without adequate follow up.  This format is felt to be most appropriate for this patient at this time.  All issues noted in this document were discussed and addressed.  A limited physical exam was performed with this format.  Please refer to the patient's chart for her consent to telehealth for [facility].   Date:  12/19/2018   ID:  Kristin Ramsey, DOB [birth year], MRN [id number]  Patient Location: Home Provider Location: Office  PCP:  Cari Caraway, MD  Cardiologist:  No primary care provider on file.  Electrophysiologist:  None   Evaluation Performed:  Follow-Up Visit  Chief Complaint: Chest discomfort  History of Present Illness:    Kristin Ramsey is a 58 y.o. female with past medical history of essential hypertension and dyslipidemia.  Patient underwent coronary angiography for significant and concerning chest pain and this did not reveal any evidence of coronary artery disease.  Her coronaries were unremarkable.  She is very happy about it.  At the time of my evaluation, the patient is alert awake oriented and in no distress.  The patient does not have symptoms concerning for COVID-19 infection (fever, chills, cough, or new shortness of breath).    Past Medical History:  Diagnosis Date  . Anxiety   . Hypertension   . Interstitial cystitis   . Shingles    Past Surgical History:  Procedure Laterality Date  . ABDOMINAL HYSTERECTOMY    . HYSTEROSCOPY    . LEFT HEART CATH AND CORONARY ANGIOGRAPHY N/A 11/25/2018   Procedure: LEFT HEART CATH AND CORONARY ANGIOGRAPHY;  Surgeon: Nelva Bush, MD;  Location: [facility];   Service: Cardiovascular;  Laterality: N/A;  . TONSILLECTOMY       Current Meds  Medication Sig  . escitalopram (LEXAPRO) 10 MG tablet Take 10 mg by mouth every evening.   Marland Kitchen estradiol (VIVELLE-DOT) 0.05 MG/24HR patch Place 1 patch onto the skin 2 (two) times a week.  . fluticasone (FLONASE) 50 MCG/ACT nasal spray Place 1 spray into both nostrils daily.  Marland Kitchen gabapentin (NEURONTIN) 300 MG capsule Take 2 capsules (600 mg total) by mouth 3 (three) times daily. (Patient taking differently: Take 300-600 mg by mouth See admin instructions. Take 1 capsule (300 mg) by mouth 3 times daily & take 2 capsules (600 mg) by mouth at bedtime.)  . hydrochlorothiazide (HYDRODIURIL) 12.5 MG tablet Take 12.5 mg by mouth daily.   Marland Kitchen lactase (LACTAID) 3000 units tablet Take 3,000 Units by mouth 3 (three) times daily as needed (prior to dairy consumption).  . LUMIFY 0.025 % SOLN Place 1 drop into both eyes daily.  . metoprolol succinate (TOPROL-XL) 50 MG 24 hr tablet Take 50 mg by mouth daily.   . Multiple Vitamin (MULTIVITAMIN WITH MINERALS) TABS tablet Take 1 tablet by mouth daily.  . nitroGLYCERIN (NITROSTAT) 0.4 MG SL tablet Place 1 tablet (0.4 mg total) under the tongue every 5 (five) minutes as needed.  . OXcarbazepine ER (OXTELLAR XR) 300 MG TB24 Take 1 tablet by mouth at bedtime. (Patient taking differently: Take 300 mg by mouth at bedtime. )  . Polyethyl Glycol-Propyl Glycol (LUBRICANT EYE DROPS) 0.4-0.3 % SOLN Place 1 drop  into both eyes 3 (three) times daily as needed (dry/irritated eyes.).  Marland Kitchen polyethylene glycol powder (GLYCOLAX/MIRALAX) 17 GM/SCOOP powder Take 1 Container by mouth daily.     Allergies:   Erythromycin, Latex, Nitrous oxide, Other, and Sulfamethoxazole-trimethoprim   Social History   Tobacco Use  . Smoking status: Never Smoker  . Smokeless tobacco: Never Used  Substance Use Topics  . Alcohol use: No    Frequency: Never  . Drug use: No     Family Hx: The patient's family history  includes Anxiety disorder in her father; Crohn's disease in her mother; Hypertension in her father; Macular degeneration in her father; Prostate cancer in her father.  ROS:   Please see the history of present illness.    As mentioned above All other systems reviewed and are negative.   Prior CV studies:   The following studies were reviewed today:  Coronary angiography report was discussed with her it was normal.  Labs/Other Tests and Data Reviewed:    EKG:  No ECG reviewed.  Recent Labs: 11/02/2018: BUN 12; Creatinine, Ser 0.77; Hemoglobin 12.8; Platelets 210; Potassium 4.0; Sodium 138   Recent Lipid Panel No results found for: CHOL, TRIG, HDL, CHOLHDL, LDLCALC, LDLDIRECT  Wt Readings from Last 3 Encounters:  12/19/18 159 lb (72.1 kg)  11/25/18 160 lb (72.6 kg)  11/02/18 161 lb 12.8 oz (73.4 kg)     Objective:    Vital Signs:  Ht 5\' 7"  (1.702 m)   Wt 159 lb (72.1 kg)   BMI 24.90 kg/m    VITAL SIGNS:  reviewed  ASSESSMENT & PLAN:    1. Chest pain discomfort: I reassured about my findings with the patient.  She needs to get an evaluation for noncardiac chest pain and she will see her primary care physician. 2. Essential hypertension: Blood pressure stable she checks it on a regular basis at home 3. Mixed dyslipidemia: Followed by primary care physician and she is dieting and exercising regularly.  She will be seen in follow-up appointment on a as needed basis only.  COVID-19 Education: The signs and symptoms of COVID-19 were discussed with the patient and how to seek care for testing (follow up with PCP or arrange E-visit).  The importance of social distancing was discussed today.  Time:   Today, I have spent 15 minutes with the patient with telehealth technology discussing the above problems.     Medication Adjustments/Labs and Tests Ordered: Current medicines are reviewed at length with the patient today.  Concerns regarding medicines are outlined above.   Tests  Ordered: No orders of the defined types were placed in this encounter.   Medication Changes: No orders of the defined types were placed in this encounter.   Follow Up:  Virtual Visit or In Person prn  Signed, Jenean Lindau, MD  12/19/2018 9:00 AM    [place]

## 2019-01-24 DIAGNOSIS — B0229 Other postherpetic nervous system involvement: Secondary | ICD-10-CM | POA: Diagnosis not present

## 2019-01-30 DIAGNOSIS — L738 Other specified follicular disorders: Secondary | ICD-10-CM | POA: Diagnosis not present

## 2019-01-30 DIAGNOSIS — L821 Other seborrheic keratosis: Secondary | ICD-10-CM | POA: Diagnosis not present

## 2019-01-30 DIAGNOSIS — Z1283 Encounter for screening for malignant neoplasm of skin: Secondary | ICD-10-CM | POA: Diagnosis not present

## 2019-02-18 DIAGNOSIS — Z23 Encounter for immunization: Secondary | ICD-10-CM | POA: Diagnosis not present

## 2019-03-22 DIAGNOSIS — R87619 Unspecified abnormal cytological findings in specimens from cervix uteri: Secondary | ICD-10-CM | POA: Insufficient documentation

## 2019-03-22 DIAGNOSIS — Z1231 Encounter for screening mammogram for malignant neoplasm of breast: Secondary | ICD-10-CM | POA: Diagnosis not present

## 2019-03-22 DIAGNOSIS — K449 Diaphragmatic hernia without obstruction or gangrene: Secondary | ICD-10-CM | POA: Insufficient documentation

## 2019-03-22 DIAGNOSIS — N952 Postmenopausal atrophic vaginitis: Secondary | ICD-10-CM | POA: Diagnosis not present

## 2019-03-22 DIAGNOSIS — Z6825 Body mass index (BMI) 25.0-25.9, adult: Secondary | ICD-10-CM | POA: Diagnosis not present

## 2019-03-22 DIAGNOSIS — M810 Age-related osteoporosis without current pathological fracture: Secondary | ICD-10-CM | POA: Insufficient documentation

## 2019-03-22 DIAGNOSIS — Z13820 Encounter for screening for osteoporosis: Secondary | ICD-10-CM | POA: Diagnosis not present

## 2019-03-22 DIAGNOSIS — Z01419 Encounter for gynecological examination (general) (routine) without abnormal findings: Secondary | ICD-10-CM | POA: Diagnosis not present

## 2019-03-22 DIAGNOSIS — G43909 Migraine, unspecified, not intractable, without status migrainosus: Secondary | ICD-10-CM | POA: Insufficient documentation

## 2019-03-22 DIAGNOSIS — N951 Menopausal and female climacteric states: Secondary | ICD-10-CM | POA: Diagnosis not present

## 2019-04-27 ENCOUNTER — Encounter: Payer: Self-pay | Admitting: Gastroenterology

## 2019-05-30 ENCOUNTER — Ambulatory Visit (AMBULATORY_SURGERY_CENTER): Payer: Self-pay

## 2019-05-30 ENCOUNTER — Other Ambulatory Visit: Payer: Self-pay

## 2019-05-30 VITALS — Temp 97.2°F | Ht 67.0 in | Wt 160.0 lb

## 2019-05-30 DIAGNOSIS — Z01818 Encounter for other preprocedural examination: Secondary | ICD-10-CM

## 2019-05-30 DIAGNOSIS — Z1211 Encounter for screening for malignant neoplasm of colon: Secondary | ICD-10-CM

## 2019-05-30 MED ORDER — NA SULFATE-K SULFATE-MG SULF 17.5-3.13-1.6 GM/177ML PO SOLN: 1.0000 | Freq: Once | ORAL | 0 refills | Status: DC

## 2019-05-30 MED ORDER — NA SULFATE-K SULFATE-MG SULF 17.5-3.13-1.6 GM/177ML PO SOLN: 1.0000 | Freq: Once | ORAL | 0 refills | Status: AC

## 2019-05-30 NOTE — Progress Notes (Signed)

## 2019-06-08 ENCOUNTER — Encounter: Payer: Self-pay | Admitting: Gastroenterology

## 2019-06-08 ENCOUNTER — Other Ambulatory Visit: Payer: Self-pay | Admitting: Gastroenterology

## 2019-06-08 DIAGNOSIS — Z1159 Encounter for screening for other viral diseases: Secondary | ICD-10-CM | POA: Diagnosis not present

## 2019-06-10 LAB — SARS CORONAVIRUS 2 (TAT 6-24 HRS): SARS Coronavirus 2: NEGATIVE

## 2019-06-13 ENCOUNTER — Other Ambulatory Visit: Payer: Self-pay

## 2019-06-13 ENCOUNTER — Ambulatory Visit (AMBULATORY_SURGERY_CENTER): Payer: 59 | Admitting: Gastroenterology

## 2019-06-13 ENCOUNTER — Encounter: Payer: Self-pay | Admitting: Gastroenterology

## 2019-06-13 VITALS — BP 140/80 | HR 63 | Temp 96.9°F | Resp 16 | Ht 67.0 in | Wt 160.0 lb

## 2019-06-13 DIAGNOSIS — D124 Benign neoplasm of descending colon: Secondary | ICD-10-CM

## 2019-06-13 DIAGNOSIS — D122 Benign neoplasm of ascending colon: Secondary | ICD-10-CM

## 2019-06-13 DIAGNOSIS — Z1211 Encounter for screening for malignant neoplasm of colon: Secondary | ICD-10-CM | POA: Diagnosis not present

## 2019-06-13 DIAGNOSIS — K635 Polyp of colon: Secondary | ICD-10-CM | POA: Diagnosis not present

## 2019-06-13 MED ORDER — SODIUM CHLORIDE 0.9 % IV SOLN: 500.0000 mL | Freq: Once | INTRAVENOUS | Status: DC

## 2019-06-13 NOTE — Patient Instructions (Addendum)
Handout given for polyps.  YOU HAD AN ENDOSCOPIC PROCEDURE TODAY AT THE [facility]:   Refer to the procedure report that was given to you for any specific questions about what was found during the examination.  If the procedure report does not answer your questions, please call your gastroenterologist to clarify.  If you requested that your care partner not be given the details of your procedure findings, then the procedure report has been included in a sealed envelope for you to review at your convenience later.  YOU SHOULD EXPECT: Some feelings of bloating in the abdomen. Passage of more gas than usual.  Walking can help get rid of the air that was put into your GI tract during the procedure and reduce the bloating. If you had a lower endoscopy (such as a colonoscopy or flexible sigmoidoscopy) you may notice spotting of blood in your stool or on the toilet paper. If you underwent a bowel prep for your procedure, you may not have a normal bowel movement for a few days.  Please Note:  You might notice some irritation and congestion in your nose or some drainage.  This is from the oxygen used during your procedure.  There is no need for concern and it should clear up in a day or so.  SYMPTOMS TO REPORT IMMEDIATELY:   Following lower endoscopy (colonoscopy or flexible sigmoidoscopy):  Excessive amounts of blood in the stool  Significant tenderness or worsening of abdominal pains  Swelling of the abdomen that is new, acute  Fever of 100F or higher  For urgent or emergent issues, a gastroenterologist can be reached at any hour by calling [phone].   DIET:  We do recommend a small meal at first, but then you may proceed to your regular diet.  Drink plenty of fluids but you should avoid alcoholic beverages for 24 hours.  ACTIVITY:  You should plan to take it easy for the rest of today and you should NOT DRIVE or use heavy machinery until tomorrow (because of the sedation  medicines used during the test).    FOLLOW UP: Our staff will call the number listed on your records 48-72 hours following your procedure to check on you and address any questions or concerns that you may have regarding the information given to you following your procedure. If we do not reach you, we will leave a message.  We will attempt to reach you two times.  During this call, we will ask if you have developed any symptoms of COVID 19. If you develop any symptoms (ie: fever, flu-like symptoms, shortness of breath, cough etc.) before then, please call [phone].  If you test positive for Covid 19 in the 2 weeks post procedure, please call and report this information to us.    If any biopsies were taken you will be contacted by phone or by letter within the next 1-3 weeks.  Please call us at [phone] if you have not heard about the biopsies in 3 weeks.    SIGNATURES/CONFIDENTIALITY: You and/or your care partner have signed paperwork which will be entered into your electronic medical record.  These signatures attest to the fact that that the information above on your After Visit Summary has been reviewed and is understood.  Full responsibility of the confidentiality of this discharge information lies with you and/or your care-partner. 

## 2019-06-13 NOTE — Progress Notes (Signed)
To PACU, VSS. Report to [email or web address] 

## 2019-06-13 NOTE — Op Note (Signed)
[facility] Patient Name: Kristin Ramsey Procedure Date: 06/13/2019 9:41 AM MRN: [id number] Endoscopist: [place]. Loletha Carrow , MD Age: 59 Referring MD:  Date of Birth: 09/16/1960 Gender: Female Account #: [id number] Procedure:                Colonoscopy Indications:              Screening for colorectal malignant neoplasm, This                            is the patient's first screening colonoscopy                            (12/2007 colonoscopy for constipation) Medicines:                Monitored Anesthesia Care Procedure:                Pre-Anesthesia Assessment:                           - Prior to the procedure, a History and Physical                            was performed, and patient medications and                            allergies were reviewed. The patient's tolerance of                            previous anesthesia was also reviewed. The risks                            and benefits of the procedure and the sedation                            options and risks were discussed with the patient.                            All questions were answered, and informed consent                            was obtained. Prior Anticoagulants: The patient has                            taken no previous anticoagulant or antiplatelet                            agents. ASA Grade Assessment: II - A patient with                            mild systemic disease. After reviewing the risks                            and benefits, the patient was deemed in  satisfactory condition to undergo the procedure.                           After obtaining informed consent, the colonoscope                            was passed under direct vision. Throughout the                            procedure, the patient's blood pressure, pulse, and                            oxygen saturations were monitored continuously. The                            Colonoscope was introduced  through the anus and                            advanced to the the cecum, identified by                            appendiceal orifice and ileocecal valve. The                            colonoscopy was performed without difficulty. The                            patient tolerated the procedure well. The quality                            of the bowel preparation was good. The ileocecal                            valve, appendiceal orifice, and rectum were                            photographed. Scope In: 9:59:29 AM Scope Out: 10:20:21 AM Scope Withdrawal Time: 0 hours 18 minutes 11 seconds  Total Procedure Duration: 0 hours 20 minutes 52 seconds  Findings:                 The perianal and digital rectal examinations were                            normal.                           A 8 mm polyp was found in the proximal ascending                            colon. The polyp was semi-sessile. The polyp was                            removed with a cold snare. Resection and retrieval  were complete.                           A 10 mm polyp was found in the proximal ascending                            colon. The polyp was sessile. The polyp was removed                            with a hot snare. Resection and retrieval were                            complete.                           A 5 mm polyp was found in the proximal descending                            colon. The polyp was semi-sessile. The polyp was                            removed with a cold snare. Resection and retrieval                            were complete.                           The exam was otherwise without abnormality on                            direct and retroflexion views. Complications:            No immediate complications. Estimated Blood Loss:     Estimated blood loss was minimal. Impression:               - One 8 mm polyp in the proximal ascending colon,                             removed with a cold snare. Resected and retrieved.                           - One 10 mm polyp in the proximal ascending colon,                            removed with a hot snare. Resected and retrieved.                           - One 5 mm polyp in the proximal descending colon,                            removed with a cold snare. Resected and retrieved.                           - The examination was otherwise normal on direct  and retroflexion views. Recommendation:           - Patient has a contact number available for                            emergencies. The signs and symptoms of potential                            delayed complications were discussed with the                            patient. Return to normal activities tomorrow.                            Written discharge instructions were provided to the                            patient.                           - Resume previous diet.                           - Continue present medications.                           - Await pathology results.                           - Repeat colonoscopy is recommended for                            surveillance. The colonoscopy date will be                            determined after pathology results from today's                            exam become available for review. Chaim Gatley L. Loletha Carrow, MD 06/13/2019 10:24:57 AM This report has been signed electronically.

## 2019-06-13 NOTE — Progress Notes (Signed)
Temp by LC, vitals by DT  Pt's states no medical or surgical changes since previsit or office visit.

## 2019-06-15 ENCOUNTER — Telehealth: Payer: Self-pay

## 2019-06-15 NOTE — Telephone Encounter (Signed)
  Follow up Call-  Call back number 06/13/2019  Permission to leave phone message Yes  Some recent data might be hidden     Patient questions:  Do you have a fever, pain , or abdominal swelling? No. Pain Score  0 *  Have you tolerated food without any problems? Yes.    Have you been able to return to your normal activities? Yes.    Do you have any questions about your discharge instructions: Diet   No. Medications  No. Follow up visit  No.  Do you have questions or concerns about your Care? No.  Actions: * If pain score is 4 or above: No action needed, pain <4.  Have you developed a fever since your procedure? No 2.   Have you had an respiratory symptoms (SOB or cough) since your procedure? No 3.   Have you tested positive for COVID 19 since your procedure No  4.   Have you had any family members/close contacts diagnosed with the COVID 19 since your procedure? No  If yes to any of these questions please route to Joylene John, RN and Alphonsa Gin, RN.

## 2019-06-19 ENCOUNTER — Telehealth: Payer: Self-pay | Admitting: Gastroenterology

## 2019-06-19 ENCOUNTER — Encounter: Payer: Self-pay | Admitting: Gastroenterology

## 2019-06-19 NOTE — Telephone Encounter (Signed)
Told the patient it would take up to a week or a little longer to have a BM but to give [place] a call back in a couple of days if she has not had a BM. Verbalized understanding.

## 2019-06-19 NOTE — Telephone Encounter (Signed)
Patient has concerns with not having a BM since her procedure

## 2019-06-22 NOTE — Telephone Encounter (Signed)
Wanted to let you know she is doing well and has had BM's

## 2019-06-22 NOTE — Telephone Encounter (Signed)
Noted  

## 2019-07-12 ENCOUNTER — Telehealth: Payer: Self-pay | Admitting: Nurse Practitioner

## 2019-07-12 MED ORDER — OXTELLAR XR 300 MG PO TB24: 1.0000 | ORAL_TABLET | Freq: Every day | ORAL | 0 refills | Status: DC

## 2019-07-12 MED ORDER — GABAPENTIN 300 MG PO CAPS: ORAL_CAPSULE | ORAL | 0 refills | Status: DC

## 2019-07-12 NOTE — Addendum Note (Signed)
Addended by: Desmond Lope on: 07/12/2019 04:19 PM   Modules accepted: Orders

## 2019-07-12 NOTE — Telephone Encounter (Signed)
The patient was last seen 06/15/19 with a request to come back in one year. I called her back to schedule the appt but she did not have her calendar with her. She will call back to schedule her yearly follow up with Butler Denmark, NP. 90-day supply of medication will be sent to the pharmacy. She will make her next appt prior to needing further refills. She confirmed she is taking the following:  1) gabapentin 300mg  - one cap in am, one cap midday, two caps QHS  2) oxcabazepine ER 300mg  - one tab QHS

## 2019-07-12 NOTE — Telephone Encounter (Signed)
Phone rep checked office voicemail's, pt was called and she stated she has been informed she must now have all [facility].  [facility] [address] [phone].  Pt states she has plenty gabapentin (NEURONTIN) 300 MG capsule but she is down to 4 days worth of OXcarbazepine ER (OXTELLAR XR) 300 MG TB24.  Pt also stated that Dr Krista Blue gave her a copay card for her OXcarbazepine ER (OXTELLAR XR) 300 MG TB24 and she has not has not had to pay for this medication.  Pt is asking if as a result of switching to [facility] will she still be able to use the copay card.  Please call.

## 2019-07-27 ENCOUNTER — Telehealth: Payer: Self-pay | Admitting: Neurology

## 2019-07-27 NOTE — Telephone Encounter (Signed)
Patient aware that Dr.Yan is okay with her getting the vaccination. She recommends either the [employer] or [place].

## 2019-07-27 NOTE — Telephone Encounter (Signed)
Pt called wanting to confirm if she can get the covid shot since she is unable to get the shingle shot

## 2019-08-10 ENCOUNTER — Other Ambulatory Visit: Payer: Self-pay | Admitting: Neurology

## 2019-09-04 ENCOUNTER — Ambulatory Visit: Payer: 59 | Admitting: Neurology

## 2019-09-04 ENCOUNTER — Encounter: Payer: Self-pay | Admitting: Neurology

## 2019-09-04 ENCOUNTER — Other Ambulatory Visit: Payer: Self-pay

## 2019-09-04 VITALS — BP 129/81 | HR 58 | Temp 97.0°F | Ht 67.0 in | Wt 155.5 lb

## 2019-09-04 DIAGNOSIS — B0229 Other postherpetic nervous system involvement: Secondary | ICD-10-CM

## 2019-09-04 MED ORDER — OXTELLAR XR 300 MG PO TB24: 2.0000 | ORAL_TABLET | Freq: Every day | ORAL | 4 refills | Status: DC

## 2019-09-04 MED ORDER — PREGABALIN 100 MG PO CAPS: 100.0000 mg | ORAL_CAPSULE | Freq: Three times a day (TID) | ORAL | 5 refills | Status: DC

## 2019-09-04 MED ORDER — OXTELLAR XR 300 MG PO TB24: 1.0000 | ORAL_TABLET | Freq: Every day | ORAL | 4 refills | Status: DC

## 2019-09-04 MED ORDER — GABAPENTIN 300 MG PO CAPS: ORAL_CAPSULE | ORAL | 4 refills | Status: DC

## 2019-09-04 NOTE — Patient Instructions (Signed)
[email or web address] 

## 2019-09-04 NOTE — Progress Notes (Signed)
[facility]  PATIENT: Kristin Ramsey DOB: 10/19/1960   HISTORY OF PRESENT ILLNESS: Kristin Ramsey is a 59 year old female, accompanied by her husband for primary care, seen in refer by  Cari Caraway, for evaluation of postherpetic neuralgia, initial evaluation was on May 10, 2017.  I reviewed and summarized the referring note, she had a past medical history of major depression, hiatal hernia, she did reported a history of chickenpox in the past,  She has history of chronic migraine, on April 09, 2017, she developed right forehead retro-orbital area headaches, she thought it was her usual migraine headaches, but she had persistent severe pain, went to the emergency room on April 10, 2017, cocktail treatment did not help her headache.  On April 11, 2017, she noticed blisters breaking out at the right forehead region, also noticed right eye light sensitivity, tearing, right upper eyelid swollen, presented to the emergency room on April 13, 2017, was diagnosis with herpes, was given valacyclovir 1 g 3 times a day for 2 weeks,  She was seen by Dr. Carolynn Sayers on April 16, 2017, later in January 2019, she was found to have right corneal involvement, received eyedrops ofloxacin 0.3%, Keflex 500 mg twice a day for 10 days,  Now she has persistent right eye sensitivity, tearing, was not able to go back to work, also right forehead region itching, burning, hypersensitivity,  She only took prescribed gabapentin 300 mg twice, does help her sleep, but complains of drowsiness, she also complains of worsening depression anxiety.  UPDATE Jun 10 2017: She has never tried Trileptal 150 mg twice a day as prescribed, worried about the side effect, overall her symptoms has improved, she is no longer has constant right eye tearing, but still has right eye foreign object sensation, no loss of vision, was seen by her ophthalmologist recently, was told she is healing  well, she continue has mild right upper eyelid swelling, right forehead skin discoloration, swelling, she is taking gabapentin 300 mg up to 4-5 tablets each day, right forehead region neuropathic pain, complains of fatigue, drowsiness, on short-term disability.  Also taking clonazepam 0.5 mg every night to help the itching and sleep,  UPDATE Sep 04 2019: She is not taking gabapentin 300 mg 6 tablets a day, Oxtellar 300 mg every night, continue complains of significant sensitivity at the right V1 distribution, exacerbated by touching, washing her hair, excessive sun exposure, right eye sensitivity has much improved, there was no significant right visual loss, she is continue follow-up by her ophthalmologist  Laboratory evaluation in July 2020 showed normal BMP, sodium of 138, CBC, hemoglobin of 12.8.  REVIEW OF SYSTEMS: Full 14 system review of systems performed and notable only for those listed, all others are neg:  As above   ALLERGIES: Allergies  Allergen Reactions  . Erythromycin Nausea And Vomiting and Other (See Comments)    Abdominal pain.  . Latex Itching  . Nitrous Oxide Nausea And Vomiting  . Other Nausea Only    Anesthesia  . Sulfamethoxazole-Trimethoprim Other (See Comments)    Fatigue/depression (lack of physical and emotional energy)    HOME MEDICATIONS: Outpatient Medications Prior to Visit  Medication Sig Dispense Refill  . escitalopram (LEXAPRO) 10 MG tablet Take 10 mg by mouth every evening.     Marland Kitchen estradiol (VIVELLE-DOT) 0.05 MG/24HR patch Place 1 patch onto the skin 2 (two) times a week.    . fluticasone (FLONASE) 50 MCG/ACT nasal spray Place 1 spray into both nostrils daily.    Marland Kitchen  gabapentin (NEURONTIN) 300 MG capsule Take one cap in am, one cap midday, two caps QHS. 360 capsule 0  . hydrochlorothiazide (HYDRODIURIL) 12.5 MG tablet Take 12.5 mg by mouth daily.   2  . lactase (LACTAID) 3000 units tablet Take 3,000 Units by mouth 3 (three) times daily as needed (prior  to dairy consumption).    . LUMIFY 0.025 % SOLN Place 1 drop into both eyes daily.    . metoprolol succinate (TOPROL-XL) 50 MG 24 hr tablet Take 50 mg by mouth daily.   0  . OXcarbazepine ER (OXTELLAR XR) 300 MG TB24 Take 1 tablet by mouth at bedtime. 90 tablet 0  . polyethylene glycol powder (GLYCOLAX/MIRALAX) 17 GM/SCOOP powder Take 17 g by mouth daily. 1/2 or 1 scoop    . Multiple Vitamin (MULTIVITAMIN WITH MINERALS) TABS tablet Take 1 tablet by mouth daily.    . nitroGLYCERIN (NITROSTAT) 0.4 MG SL tablet Place 1 tablet (0.4 mg total) under the tongue every 5 (five) minutes as needed. 25 tablet 3  . Polyethyl Glycol-Propyl Glycol (LUBRICANT EYE DROPS) 0.4-0.3 % SOLN Place 1 drop into both eyes 3 (three) times daily as needed (dry/irritated eyes.).     No facility-administered medications prior to visit.    PAST MEDICAL HISTORY: Past Medical History:  Diagnosis Date  . Anxiety   . Arthritis   . GERD (gastroesophageal reflux disease)   . Hypertension   . Interstitial cystitis   . Shingles     PAST SURGICAL HISTORY: Past Surgical History:  Procedure Laterality Date  . ABDOMINAL HYSTERECTOMY    . COLONOSCOPY  2009   normal  . HYSTEROSCOPY    . LEFT HEART CATH AND CORONARY ANGIOGRAPHY N/A 11/25/2018   Procedure: LEFT HEART CATH AND CORONARY ANGIOGRAPHY;  Surgeon: Nelva Bush, MD;  Location: [facility];  Service: Cardiovascular;  Laterality: N/A;  . TONSILLECTOMY    . UPPER GASTROINTESTINAL ENDOSCOPY  2009   hiatal hernia    FAMILY HISTORY: Family History  Problem Relation Age of Onset  . Crohn's disease Mother   . Hypertension Father   . Anxiety disorder Father   . Macular degeneration Father   . Prostate cancer Father   . Colon polyps Father   . Colon cancer Neg Hx   . Esophageal cancer Neg Hx   . Rectal cancer Neg Hx   . Stomach cancer Neg Hx     SOCIAL HISTORY: Social History   Socioeconomic History  . Marital status: Married    Spouse name: Not on  file  . Number of children: 1  . Years of education: 47  . Highest education level: Bachelor's degree (e.g., BA, AB, BS)  Occupational History  . 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 Smoking status: Former Smoker    Types: Cigarettes  . Smokeless tobacco: Never Used  . Tobacco comment: 5 years in 69s  Substance and Sexual Activity  . Alcohol use: No  . Drug use: No  . Sexual activity: Not on file  Other Topics Concern  . Not on file  Social History Narrative   Lives at home with husband.   Right-handed.   0.5 cup of coffee per day, occasional soda.   Social Determinants of Health   Financial Resource Strain:   . Difficulty of Paying Living Expenses:   Food Insecurity:   . Worried About Charity fundraiser in the Last Year:   . [place] in the Last Year:  Transportation Needs:   . Film/video editor (Medical):   Marland Kitchen Lack of Transportation (Non-Medical):   Physical Activity:   . Days of Exercise per Week:   . Minutes of Exercise per Session:   Stress:   . Feeling of Stress :   Social Connections:   . Frequency of Communication with Friends and Family:   . Frequency of Social Gatherings with Friends and Family:   . Attends Religious Services:   . Active Member of Clubs or Organizations:   . Attends Archivist Meetings:   Marland Kitchen Marital Status:   Intimate Partner Violence:   . Fear of Current or Ex-Partner:   . Emotionally Abused:   Marland Kitchen Physically Abused:   . Sexually Abused:      PHYSICAL EXAM  Vitals:   09/04/19 1259  BP: 129/81  Pulse: (!) 58  Temp: (!) 97 F (36.1 C)  Weight: 155 lb 8 oz (70.5 kg)  Height: 5\' 7"  (1.702 m)   Body mass index is 24.35 kg/m.  Generalized: Well developed, in no acute distress  Head: normocephalic and atraumatic,. Oropharynx benign  Neck: Supple,  Musculoskeletal: No deformity   Neurological examination   Mentation: Alert oriented to time, place, history taking. Attention span and concentration  appropriate. Recent and remote memory intact.  Follows all commands speech and language fluent.   Cranial nerve II-XII: Pupils were equal round reactive to light extraocular movements were full, visual field were full on confrontational test. Facial sensation and strength were normal with exception of increased hypersensitivity of right V1 distribution, hearing was intact to finger rubbing bilaterally. Uvula tongue midline. head turning and shoulder shrug were normal and symmetric.Tongue protrusion into cheek strength was normal. Motor: normal bulk and tone, full strength in the BUE, BLE,  Sensory: Mild decreased light touch in V1 and V2 distribution Coordination: finger-nose-finger, heel-to-shin bilaterally, no dysmetria Reflexes: Brachioradialis 2/2, biceps 2/2, triceps 2/2, patellar 2/2, Achilles 2/2, plantar responses were flexor bilaterally. Gait and Station: Rising up from seated position without assistance, normal stance,  moderate stride, good arm swing, smooth turning, able to perform tiptoe, and heel walking without difficulty. Tandem gait is steady  DIAGNOSTIC DATA (LABS, IMAGING, TESTING) - I reviewed patient records, labs, notes, testing and imaging myself where available.  Lab Results  Component Value Date   WBC 5.6 11/02/2018   HGB 12.8 11/02/2018   HCT 37.0 11/02/2018   MCV 87 11/02/2018   PLT 210 11/02/2018      Component Value Date/Time   NA 138 11/02/2018 1541   K 4.0 11/02/2018 1541   CL 97 11/02/2018 1541   CO2 25 11/02/2018 1541   GLUCOSE 84 11/02/2018 1541   GLUCOSE 87 05/21/2009 1224   BUN 12 11/02/2018 1541   CREATININE 0.77 11/02/2018 1541   CALCIUM 9.0 11/02/2018 1541   PROT 7.5 05/21/2009 1224   ALBUMIN 4.3 05/21/2009 1224   AST 20 05/21/2009 1224   ALT 15 05/21/2009 1224   ALKPHOS 51 05/21/2009 1224   BILITOT 0.3 05/21/2009 1224   GFRNONAA 87 11/02/2018 1541   GFRAA 100 11/02/2018 1541    Lab Results  Component Value Date   VITAMINB12 290  05/21/2009   Lab Results  Component Value Date   TSH 0.79 05/21/2009      ASSESSMENT AND PLAN Shingle involving right V1, mild involvement of right V2 in December 2018 Prolonged retractable postherpetic neuralgia  Will increase Oxtellar to 300 mg 2 tablets every night  Suboptimal response to gabapentin  up to 600 mg 3 times a day, will change to Lyrica 100 mg 3 times a day  Laboratory evaluation to rule out hyponatremia              Marcial Pacas, M.D. Ph.D.  [facility] [place], [place] [address] Phone: [phone] Fax:      [phone]

## 2019-09-05 ENCOUNTER — Encounter: Payer: Self-pay | Admitting: *Deleted

## 2019-09-05 ENCOUNTER — Telehealth: Payer: Self-pay | Admitting: *Deleted

## 2019-09-05 LAB — COMPREHENSIVE METABOLIC PANEL
ALT: 9 IU/L (ref 0–32)
AST: 16 IU/L (ref 0–40)
Albumin/Globulin Ratio: 1.7 (ref 1.2–2.2)
Albumin: 4.3 g/dL (ref 3.8–4.9)
Alkaline Phosphatase: 66 IU/L (ref 48–121)
BUN/Creatinine Ratio: 24 — ABNORMAL HIGH (ref 9–23)
BUN: 17 mg/dL (ref 6–24)
Bilirubin Total: 0.2 mg/dL (ref 0.0–1.2)
CO2: 26 mmol/L (ref 20–29)
Calcium: 9.3 mg/dL (ref 8.7–10.2)
Chloride: 101 mmol/L (ref 96–106)
Creatinine, Ser: 0.72 mg/dL (ref 0.57–1.00)
GFR calc Af Amer: 108 mL/min/{1.73_m2} (ref >59)
GFR calc non Af Amer: 93 mL/min/{1.73_m2} (ref >59)
Globulin, Total: 2.5 g/dL (ref 1.5–4.5)
Glucose: 84 mg/dL (ref 65–99)
Potassium: 5.2 mmol/L (ref 3.5–5.2)
Sodium: 139 mmol/L (ref 134–144)
Total Protein: 6.8 g/dL (ref 6.0–8.5)

## 2019-09-05 LAB — CBC WITH DIFFERENTIAL
Basophils Absolute: 0 10*3/uL (ref 0.0–0.2)
Basos: 1 %
EOS (ABSOLUTE): 0.1 10*3/uL (ref 0.0–0.4)
Eos: 1 %
Hematocrit: 40.5 % (ref 34.0–46.6)
Hemoglobin: 13.5 g/dL (ref 11.1–15.9)
Immature Grans (Abs): 0 10*3/uL (ref 0.0–0.1)
Immature Granulocytes: 1 %
Lymphocytes Absolute: 1.9 10*3/uL (ref 0.7–3.1)
Lymphs: 32 %
MCH: 30.8 pg (ref 26.6–33.0)
MCHC: 33.3 g/dL (ref 31.5–35.7)
MCV: 92 fL (ref 79–97)
Monocytes Absolute: 0.7 10*3/uL (ref 0.1–0.9)
Monocytes: 11 %
Neutrophils Absolute: 3.3 10*3/uL (ref 1.4–7.0)
Neutrophils: 54 %
RBC: 4.39 x10E6/uL (ref 3.77–5.28)
RDW: 12.3 % (ref 11.7–15.4)
WBC: 6 10*3/uL (ref 3.4–10.8)

## 2019-09-05 LAB — TSH: TSH: 1.49 u[IU]/mL (ref 0.450–4.500)

## 2019-09-05 NOTE — Telephone Encounter (Signed)
I spoke to the patient and she verbalized understanding of the results.   She is asking for a letter that states it is still beneficial for her to work from home until her next appt on 03/06/20 when she will be re-evaluated. She is an Astronomer.

## 2019-09-05 NOTE — Telephone Encounter (Signed)
Per vo by Dr. Krista Blue, okay to provide the requested letter. The patient would like it mailed to her home address. This has been completed for her.

## 2019-09-05 NOTE — Telephone Encounter (Signed)
-----   Message from Marcial Pacas, MD sent at 09/05/2019  3:27 PM EDT ----- Please call patient laboratory evaluation showed no significant abnormalities.

## 2019-09-19 DIAGNOSIS — R69 Illness, unspecified: Secondary | ICD-10-CM | POA: Diagnosis not present

## 2019-09-19 DIAGNOSIS — E785 Hyperlipidemia, unspecified: Secondary | ICD-10-CM | POA: Diagnosis not present

## 2019-09-19 DIAGNOSIS — B0229 Other postherpetic nervous system involvement: Secondary | ICD-10-CM | POA: Diagnosis not present

## 2019-09-19 DIAGNOSIS — I10 Essential (primary) hypertension: Secondary | ICD-10-CM | POA: Diagnosis not present

## 2019-09-19 DIAGNOSIS — G43009 Migraine without aura, not intractable, without status migrainosus: Secondary | ICD-10-CM | POA: Diagnosis not present

## 2020-02-01 DIAGNOSIS — M25511 Pain in right shoulder: Secondary | ICD-10-CM | POA: Diagnosis not present

## 2020-02-01 DIAGNOSIS — Z23 Encounter for immunization: Secondary | ICD-10-CM | POA: Diagnosis not present

## 2020-03-06 ENCOUNTER — Ambulatory Visit: Payer: 59 | Admitting: Neurology

## 2020-03-20 ENCOUNTER — Other Ambulatory Visit: Payer: Self-pay | Admitting: Neurology

## 2020-03-20 MED ORDER — PREGABALIN 100 MG PO CAPS: 100.0000 mg | ORAL_CAPSULE | Freq: Three times a day (TID) | ORAL | 0 refills | Status: DC

## 2020-03-20 NOTE — Telephone Encounter (Signed)
Pt request refill pregabalin (LYRICA) 100 MG capsule at [id number].  Pt has appt scheduled 12/7.

## 2020-03-26 ENCOUNTER — Ambulatory Visit: Payer: No Typology Code available for payment source | Admitting: Neurology

## 2020-03-26 ENCOUNTER — Encounter: Payer: Self-pay | Admitting: Neurology

## 2020-03-26 VITALS — BP 120/74 | HR 60 | Ht 67.0 in | Wt 158.5 lb

## 2020-03-26 DIAGNOSIS — B0229 Other postherpetic nervous system involvement: Secondary | ICD-10-CM | POA: Diagnosis not present

## 2020-03-26 MED ORDER — OXTELLAR XR 300 MG PO TB24
1.0000 | ORAL_TABLET | Freq: Every day | ORAL | 4 refills | Status: DC
Start: 2020-03-26 — End: 2021-04-15

## 2020-03-26 MED ORDER — PREGABALIN 100 MG PO CAPS
100.0000 mg | ORAL_CAPSULE | Freq: Three times a day (TID) | ORAL | 4 refills | Status: DC
Start: 2020-03-26 — End: 2020-10-17

## 2020-03-26 NOTE — Progress Notes (Signed)
[facility]  HISTORY OF PRESENT ILLNESS: Kristin Ramsey is a 59 year old female, accompanied by her husband, seen in refer by  Cari Caraway, for evaluation of postherpetic neuralgia, initial evaluation was on May 10, 2017.  I reviewed and summarized the referring note, she had a past medical history of major depression, hiatal hernia, she did reported a history of chickenpox in the past,  She has history of chronic migraine, on April 09, 2017, she developed right forehead retro-orbital area headaches, she thought it was her usual migraine headaches, but she had persistent severe pain, went to the emergency room on April 10, 2017, cocktail treatment did not help her headache.  On April 11, 2017, she noticed blisters breaking out at the right forehead region, also noticed right eye light sensitivity, tearing, right upper eyelid swollen, presented to the emergency room on April 13, 2017, was diagnosis with herpes, was given valacyclovir 1 g 3 times a day for 2 weeks,  She was seen by Dr. Carolynn Sayers on April 16, 2017, later in January 2019, she was found to have right corneal involvement, received eyedrops ofloxacin 0.3%, Keflex 500 mg twice a day for 10 days,  Now she has persistent right eye sensitivity, tearing, was not able to go back to work, also right forehead region itching, burning, hypersensitivity,  She only took prescribed gabapentin 300 mg twice, does help her sleep, but complains of drowsiness, she also complains of worsening depression anxiety.  UPDATE Jun 10 2017: She has never tried Trileptal 150 mg twice a day as prescribed, worried about the side effect, overall her symptoms has improved, she is no longer has constant right eye tearing, but still has right eye foreign object sensation, no loss of vision, was seen by her ophthalmologist recently, was told she is healing well, she continue has mild right upper eyelid swelling, right  forehead skin discoloration, swelling, she is taking gabapentin 300 mg up to 4-5 tablets each day, right forehead region neuropathic pain, complains of fatigue, drowsiness, on short-term disability.  Also taking clonazepam 0.5 mg every night to help the itching and sleep,  UPDATE Sep 04 2019: She is not taking gabapentin 300 mg 6 tablets a day, Oxtellar 300 mg every night, continue complains of significant sensitivity at the right V1 distribution, exacerbated by touching, washing her hair, excessive sun exposure, right eye sensitivity has much improved, there was no significant right visual loss, she is continue follow-up by her ophthalmologist  Laboratory evaluation in July 2020 showed normal BMP, sodium of 138, CBC, hemoglobin of 12.8.  UPDATE Mar 26 2020: She is very happy with current combination, Lyrica 100 mg 3 times a day, she has decreased Oxtellar to 300 mg at bedtime, she tolerated the medication well, no apparent side effect, occasional weakness in the right V1 territory  REVIEW OF SYSTEMS: Full 14 system review of systems performed and notable only for those listed, all others are neg:  As above   ALLERGIES: Allergies  Allergen Reactions  . Erythromycin Nausea And Vomiting and Other (See Comments)    Abdominal pain.  . Latex Itching  . Nitrous Oxide Nausea And Vomiting  . Other Nausea Only    Anesthesia  . Sulfamethoxazole-Trimethoprim Other (See Comments)    Fatigue/depression (lack of physical and emotional energy)    HOME MEDICATIONS: Outpatient Medications Prior to Visit  Medication Sig Dispense Refill  . escitalopram (LEXAPRO) 10 MG tablet Take 10 mg by mouth every evening.     Marland Kitchen estradiol (  VIVELLE-DOT) 0.05 MG/24HR patch Place 1 patch onto the skin 2 (two) times a week.    . fluticasone (FLONASE) 50 MCG/ACT nasal spray Place 1 spray into both nostrils daily.    . hydrochlorothiazide (HYDRODIURIL) 12.5 MG tablet Take 12.5 mg by mouth daily.   2  . lactase (LACTAID)  3000 units tablet Take 3,000 Units by mouth 3 (three) times daily as needed (prior to dairy consumption).    . LUMIFY 0.025 % SOLN Place 1 drop into both eyes daily.    . metoprolol succinate (TOPROL-XL) 50 MG 24 hr tablet Take 50 mg by mouth daily.   0  . OXcarbazepine ER (OXTELLAR XR) 300 MG TB24 Take 2 tablets by mouth at bedtime. 180 tablet 4  . polyethylene glycol powder (GLYCOLAX/MIRALAX) 17 GM/SCOOP powder Take 17 g by mouth daily. 1/2 or 1 scoop    . pregabalin (LYRICA) 100 MG capsule Take 1 capsule (100 mg total) by mouth 3 (three) times daily. 90 capsule 0  . gabapentin (NEURONTIN) 300 MG capsule Take one cap in am, one cap midday, two caps QHS. 360 capsule 4   No facility-administered medications prior to visit.    PAST MEDICAL HISTORY: Past Medical History:  Diagnosis Date  . Anxiety   . Arthritis   . GERD (gastroesophageal reflux disease)   . Hypertension   . Interstitial cystitis   . Shingles     PAST SURGICAL HISTORY: Past Surgical History:  Procedure Laterality Date  . ABDOMINAL HYSTERECTOMY    . COLONOSCOPY  2009   normal  . HYSTEROSCOPY    . LEFT HEART CATH AND CORONARY ANGIOGRAPHY N/A 11/25/2018   Procedure: LEFT HEART CATH AND CORONARY ANGIOGRAPHY;  Surgeon: Nelva Bush, MD;  Location: [facility];  Service: Cardiovascular;  Laterality: N/A;  . TONSILLECTOMY    . UPPER GASTROINTESTINAL ENDOSCOPY  2009   hiatal hernia    FAMILY HISTORY: Family History  Problem Relation Age of Onset  . Crohn's disease Mother   . Hypertension Father   . Anxiety disorder Father   . Macular degeneration Father   . Prostate cancer Father   . Colon polyps Father   . Colon cancer Neg Hx   . Esophageal cancer Neg Hx   . Rectal cancer Neg Hx   . Stomach cancer Neg Hx     SOCIAL HISTORY: Social History   Socioeconomic History  . Marital status: Married    Spouse name: Not on file  . Number of children: 1  . Years of education: 18  . Highest education  level: Bachelor's degree (e.g., BA, AB, BS)  Occupational History  . 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 Smoking status: Former Smoker    Types: Cigarettes  . Smokeless tobacco: Never Used  . Tobacco comment: 5 years in 27s  Vaping Use  . Vaping Use: Never used  Substance and Sexual Activity  . Alcohol use: No  . Drug use: No  . Sexual activity: Not on file  Other Topics Concern  . Not on file  Social History Narrative   Lives at home with husband.   Right-handed.   0.5 cup of coffee per day, occasional soda.   Social Determinants of Health   Financial Resource Strain:   . Difficulty of Paying Living Expenses: Not on file  Food Insecurity:   . Worried About Charity fundraiser in the Last Year: Not on file  . Ran Out of Food in the Last Year:  Not on file  Transportation Needs:   . Lack of Transportation (Medical): Not on file  . Lack of Transportation (Non-Medical): Not on file  Physical Activity:   . Days of Exercise per Week: Not on file  . Minutes of Exercise per Session: Not on file  Stress:   . Feeling of Stress : Not on file  Social Connections:   . Frequency of Communication with Friends and Family: Not on file  . Frequency of Social Gatherings with Friends and Family: Not on file  . Attends Religious Services: Not on file  . Active Member of Clubs or Organizations: Not on file  . Attends Archivist Meetings: Not on file  . Marital Status: Not on file  Intimate Partner Violence:   . Fear of Current or Ex-Partner: Not on file  . Emotionally Abused: Not on file  . Physically Abused: Not on file  . Sexually Abused: Not on file     PHYSICAL EXAM  Vitals:   03/26/20 0843  BP: 120/74  Pulse: 60  Weight: 158 lb 8 oz (71.9 kg)  Height: 5\' 7"  (1.702 m)   Body mass index is 24.82 kg/m.  Generalized: Well developed, in no acute distress  Head: normocephalic and atraumatic,. Oropharynx benign  Neck: Supple,  Musculoskeletal: No deformity    Neurological examination   Mentation: Alert oriented to time, place, history taking. Attention span and concentration appropriate. Recent and remote memory intact.  Follows all commands speech and language fluent.   Cranial nerve II-XII: Pupils were equal round reactive to light extraocular movements were full, visual field were full on confrontational test. Facial sensation and strength were normal with exception of increased hypersensitivity of right V1 distribution, hearing was intact to finger rubbing bilaterally. Uvula tongue midline. head turning and shoulder shrug were normal and symmetric.Tongue protrusion into cheek strength was normal. Motor: normal bulk and tone, full strength in the BUE, BLE,  Sensory: Mild decreased light touch in V1 and V2 distribution Coordination: finger-nose-finger, heel-to-shin bilaterally, no dysmetria Reflexes: Brachioradialis 2/2, biceps 2/2, triceps 2/2, patellar 2/2, Achilles 2/2, plantar responses were flexor bilaterally. Gait and Station: Rising up from seated position without assistance, normal stance,  moderate stride, good arm swing, smooth turning, able to perform tiptoe, and heel walking without difficulty. Tandem gait is steady  DIAGNOSTIC DATA (LABS, IMAGING, TESTING) - I reviewed patient records, labs, notes, testing and imaging myself where available.  Lab Results  Component Value Date   WBC 6.0 09/04/2019   HGB 13.5 09/04/2019   HCT 40.5 09/04/2019   MCV 92 09/04/2019   PLT 210 11/02/2018      Component Value Date/Time   NA 139 09/04/2019 1348   K 5.2 09/04/2019 1348   CL 101 09/04/2019 1348   CO2 26 09/04/2019 1348   GLUCOSE 84 09/04/2019 1348   GLUCOSE 87 05/21/2009 1224   BUN 17 09/04/2019 1348   CREATININE 0.72 09/04/2019 1348   CALCIUM 9.3 09/04/2019 1348   PROT 6.8 09/04/2019 1348   ALBUMIN 4.3 09/04/2019 1348   AST 16 09/04/2019 1348   ALT 9 09/04/2019 1348   ALKPHOS 66 09/04/2019 1348   BILITOT 0.2 09/04/2019 1348     GFRNONAA 93 09/04/2019 1348   GFRAA 108 09/04/2019 1348    Lab Results  Component Value Date   VITAMINB12 290 05/21/2009   Lab Results  Component Value Date   TSH 1.490 09/04/2019      ASSESSMENT AND PLAN Shingle involving right V1, mild  involvement of right V2 in December 2018 Prolonged retractable postherpetic neuralgia  Keep Oxtellar to 300 mg 1 tablet every night  Suboptimal response to gabapentin up to 600 mg 3 times a day  Responding much better to Lyrica 100 mg 3 times a day, refill prescription  Laboratory evaluations in May 2021 showed normal CMP, sodium of 139, CBC hemoglobin of 13.5, TSH              Marcial Pacas, M.D. Ph.D.  [facility] [place], [place] [address] Phone: [phone] Fax:      [phone]

## 2020-04-03 DIAGNOSIS — Z01419 Encounter for gynecological examination (general) (routine) without abnormal findings: Secondary | ICD-10-CM | POA: Diagnosis not present

## 2020-04-03 DIAGNOSIS — Z6825 Body mass index (BMI) 25.0-25.9, adult: Secondary | ICD-10-CM | POA: Diagnosis not present

## 2020-04-03 DIAGNOSIS — Z13228 Encounter for screening for other metabolic disorders: Secondary | ICD-10-CM | POA: Diagnosis not present

## 2020-04-03 DIAGNOSIS — Z1329 Encounter for screening for other suspected endocrine disorder: Secondary | ICD-10-CM | POA: Diagnosis not present

## 2020-04-03 DIAGNOSIS — Z13220 Encounter for screening for lipoid disorders: Secondary | ICD-10-CM | POA: Diagnosis not present

## 2020-04-03 DIAGNOSIS — Z1231 Encounter for screening mammogram for malignant neoplasm of breast: Secondary | ICD-10-CM | POA: Diagnosis not present

## 2020-04-03 DIAGNOSIS — Z1321 Encounter for screening for nutritional disorder: Secondary | ICD-10-CM | POA: Diagnosis not present

## 2020-04-21 ENCOUNTER — Other Ambulatory Visit: Payer: Self-pay | Admitting: Neurology

## 2020-06-10 DIAGNOSIS — N951 Menopausal and female climacteric states: Secondary | ICD-10-CM | POA: Diagnosis not present

## 2020-06-10 DIAGNOSIS — R69 Illness, unspecified: Secondary | ICD-10-CM | POA: Diagnosis not present

## 2020-06-10 DIAGNOSIS — I10 Essential (primary) hypertension: Secondary | ICD-10-CM | POA: Diagnosis not present

## 2020-06-10 DIAGNOSIS — B0229 Other postherpetic nervous system involvement: Secondary | ICD-10-CM | POA: Diagnosis not present

## 2020-07-18 ENCOUNTER — Other Ambulatory Visit: Payer: Self-pay | Admitting: *Deleted

## 2020-07-22 NOTE — Patient Instructions (Signed)
Goals Addressed            This Visit's Progress   . Track and Manage My Blood Pressure-Hypertension       Timeframe:  Long-Range Goal Priority:  High Start Date:    t                         Expected End Date:      [id number]                 Follow Up Date [id number]   - check blood pressure weekly - choose a place to take my blood pressure (home, clinic or office, retail store) - write blood pressure results in a log or diary    Why is this important?    You won't feel high blood pressure, but it can still hurt your blood vessels.   High blood pressure can cause heart or kidney problems. It can also cause a stroke.   Making lifestyle changes like losing a little weight or eating less salt will help.   Checking your blood pressure at home and at different times of the day can help to control blood pressure.   If the doctor prescribes medicine remember to take it the way the doctor ordered.   Call the office if you cannot afford the medicine or if there are questions about it.     Notes:

## 2020-07-22 NOTE — Addendum Note (Signed)
Addended by: Verlin Grills on: 07/22/2020 06:54 PM   Modules accepted: Orders

## 2020-07-22 NOTE — Patient Outreach (Signed)
[facility]) Care Management  07/22/2020  Kristin Ramsey 1961-03-29 [id number]  RN Health Coach attempted outreach screening call to patient.  Patient was unavailable. HIPPA compliance voicemail message left with return callback number.  Plan: RN will call back within 10 business days  [facility] [phone]

## 2020-07-22 NOTE — Patient Outreach (Signed)
[facility]) Care Management  [place]  07/22/2020   Kristin Ramsey [birth year] [id number]  RN Health Coach telephone call to patient.  Hipaa compliance verified. Per patient she is doing good. She monitors her blood pressure every once in a while. Per patient her readings have been good. Patient stated that she has been taking her medications as per ordered.  Patient stated that she has been under a lot of stress due to her mother dying . She has been helping her brother take care of father out of town. She has not been on a low salt diet but does not put additional sodium on her food. Per patient she did not need the RN services but has agreed to follow social worker referral. Patient agreed to the Health coach calling every 6 months for follow up.  Encounter Medications:  Outpatient Encounter Medications as of 07/18/2020  Medication Sig  . escitalopram (LEXAPRO) 10 MG tablet Take 10 mg by mouth every evening.   Marland Kitchen estradiol (VIVELLE-DOT) 0.05 MG/24HR patch Place 1 patch onto the skin 2 (two) times a week.  . fluticasone (FLONASE) 50 MCG/ACT nasal spray Place 1 spray into both nostrils daily.  . hydrochlorothiazide (HYDRODIURIL) 12.5 MG tablet Take 12.5 mg by mouth daily.   Marland Kitchen lactase (LACTAID) 3000 units tablet Take 3,000 Units by mouth 3 (three) times daily as needed (prior to dairy consumption).  . LUMIFY 0.025 % SOLN Place 1 drop into both eyes daily.  . metoprolol succinate (TOPROL-XL) 50 MG 24 hr tablet Take 50 mg by mouth daily.   . OXcarbazepine ER (OXTELLAR XR) 300 MG TB24 Take 1 tablet by mouth at bedtime.  . polyethylene glycol powder (GLYCOLAX/MIRALAX) 17 GM/SCOOP powder Take 17 g by mouth daily. 1/2 or 1 scoop  . pregabalin (LYRICA) 100 MG capsule Take 1 capsule (100 mg total) by mouth 3 (three) times daily.   No facility-administered encounter medications on file as of 07/18/2020.    Functional Status:  No flowsheet data found.  Fall/Depression  Screening: No flowsheet data found. PHQ 2/9 Scores 07/22/2020  PHQ - 2 Score 1    Assessment:  Goals Addressed            This Visit's Progress   . Track and Manage My Blood Pressure-Hypertension       Timeframe:  Long-Range Goal Priority:  High Start Date:    t                         Expected End Date:      [id number]                 Follow Up Date [id number]   - check blood pressure weekly - choose a place to take my blood pressure (home, clinic or office, retail store) - write blood pressure results in a log or diary    Why is this important?    You won't feel high blood pressure, but it can still hurt your blood vessels.   High blood pressure can cause heart or kidney problems. It can also cause a stroke.   Making lifestyle changes like losing a little weight or eating less salt will help.   Checking your blood pressure at home and at different times of the day can help to control blood pressure.   If the doctor prescribes medicine remember to take it the way the doctor ordered.   Call the office if you  cannot afford the medicine or if there are questions about it.     Notes:        Plan:  Follow-up:  Patient agrees to Care Plan and Follow-up.  RN Provided an Copywriter, advertising sent A matter of control blood pressure booklet RN sent a picture sheet of foods high and low in sodium Patient will monitor blood pressure and document Referred to Social Worker RN sent assessment to PCP RN will follow up outreach within the month of October  Kristin Ramsey [place] Management [phone]  .

## 2020-07-23 ENCOUNTER — Telehealth: Payer: Self-pay | Admitting: *Deleted

## 2020-07-23 NOTE — Patient Outreach (Signed)
[facility]) Care Management  07/23/2020  Kristin Ramsey 04/28/1960 [id number]   CSW received referral and attempted initial outreach to pt today. CSW was unable to reach pt and left a HIPPA compliant voice message for pt.  CSW will await callback or try again in 3-4 business days if no return call is received.   Eduard Clos, MSW, [place] Worker  [place] [phone]

## 2020-07-25 ENCOUNTER — Telehealth: Payer: Self-pay | Admitting: *Deleted

## 2020-07-25 NOTE — Telephone Encounter (Signed)
CSW attempted a second initial outreach to pt and was unable to speak with pt. CSW was able to leave a HIPPA compliant voice message and will await a callback, or try again per protocol in 3-4 business days. CSW will mail pt an Unsuccessful Outreach Letter also.   Eduard Clos, MSW, [place] Worker  [place] [phone]'

## 2020-08-01 ENCOUNTER — Telehealth: Payer: Self-pay | Admitting: *Deleted

## 2020-08-01 ENCOUNTER — Other Ambulatory Visit: Payer: Self-pay | Admitting: *Deleted

## 2020-08-01 NOTE — Patient Outreach (Signed)
[facility]) Care Management  08/01/2020  Kristin Ramsey 04/24/1960 [id number]  CSW attempted a 3rd outreach call to pt for initial outreach and was unable to speak with pt. CSW was able to leave a HIPPA compliant voice message and will await callback or try again in 30 days per policy.   Eduard Clos, MSW, [place] Worker  [place] [phone]

## 2020-08-16 DIAGNOSIS — R399 Unspecified symptoms and signs involving the genitourinary system: Secondary | ICD-10-CM | POA: Diagnosis not present

## 2020-08-30 ENCOUNTER — Other Ambulatory Visit: Payer: Self-pay

## 2020-08-30 ENCOUNTER — Other Ambulatory Visit (HOSPITAL_BASED_OUTPATIENT_CLINIC_OR_DEPARTMENT_OTHER): Payer: Self-pay | Admitting: Family Medicine

## 2020-08-30 ENCOUNTER — Ambulatory Visit (HOSPITAL_BASED_OUTPATIENT_CLINIC_OR_DEPARTMENT_OTHER)
Admission: RE | Admit: 2020-08-30 | Discharge: 2020-08-30 | Disposition: A | Discharge status: Home / Self Care | Payer: 59 | Source: Ambulatory Visit | Attending: Family Medicine | Admitting: Family Medicine

## 2020-08-30 ENCOUNTER — Encounter: Payer: Self-pay | Admitting: *Deleted

## 2020-08-30 ENCOUNTER — Other Ambulatory Visit: Payer: Self-pay | Admitting: *Deleted

## 2020-08-30 DIAGNOSIS — R1084 Generalized abdominal pain: Secondary | ICD-10-CM | POA: Diagnosis not present

## 2020-08-30 DIAGNOSIS — R109 Unspecified abdominal pain: Secondary | ICD-10-CM | POA: Diagnosis not present

## 2020-08-30 NOTE — Patient Outreach (Signed)
[facility]) Care Management  08/30/2020  Kristin Ramsey 1960-12-05 [id number]  CSW attempted a final initial outreach call to pt today and was unsuccessful. CSW left a HIPPA compliant voice message and will plan to sign off and await pt outreach call if interested.  CSW will advise [facility] and PCP of above.   Eduard Clos, MSW, [place] Worker  [place] [phone]

## 2020-09-02 ENCOUNTER — Ambulatory Visit: Payer: 59 | Admitting: *Deleted

## 2020-09-05 ENCOUNTER — Telehealth: Payer: Self-pay

## 2020-09-05 NOTE — Telephone Encounter (Signed)
Received a voicemail from [place] at Dr. Abigail Butts McNeill's office in regards to mutual patient. Kristin Ramsey states that patient recently had a CT scan that showed a possible sessile sigmoid polyp. Dr. Addison Lank would like for you to review the CT scan and determine necessary follow up for the patient. A copy of the CT scan has been faxed and it is also available in epic. Please advise, thanks.

## 2020-09-05 NOTE — Telephone Encounter (Signed)
The radiologist indicates that it might be a polyp but it might also be stool.  CT scan appears to been done for abdominal pain.  I think it would be best for the patient have a clinic appointment with me to decide how best to proceed.  - HD

## 2020-09-06 NOTE — Telephone Encounter (Signed)
Spoke with patient and advised of recommendations. Patient has been scheduled for a follow up with Dr. Loletha Carrow on Wednesday, 09/18/20 at 8:20 AM. Patient had no concerns at the end of the call.

## 2020-09-18 ENCOUNTER — Ambulatory Visit: Payer: 59 | Admitting: Gastroenterology

## 2020-09-18 ENCOUNTER — Encounter: Payer: Self-pay | Admitting: Gastroenterology

## 2020-09-18 VITALS — BP 130/90 | HR 59 | Ht 67.0 in | Wt 159.0 lb

## 2020-09-18 DIAGNOSIS — K5909 Other constipation: Secondary | ICD-10-CM

## 2020-09-18 DIAGNOSIS — R933 Abnormal findings on diagnostic imaging of other parts of digestive tract: Secondary | ICD-10-CM

## 2020-09-18 DIAGNOSIS — R1030 Lower abdominal pain, unspecified: Secondary | ICD-10-CM | POA: Diagnosis not present

## 2020-09-18 DIAGNOSIS — R140 Abdominal distension (gaseous): Secondary | ICD-10-CM | POA: Diagnosis not present

## 2020-09-18 MED ORDER — PLENVU 140 G PO SOLR: 1.0000 | ORAL | 0 refills | Status: DC

## 2020-09-18 NOTE — Patient Instructions (Addendum)
It was a pleasure to see you today. Based on our discussion, I am providing you with my recommendations below:  RECOMMENDATION(S):   I am providing you with samples of Linzess, BOTH 66mcg and 193mcg. Please take instead of Miralax.  COLONOSCOPY:   . You have been scheduled for a colonoscopy. Please follow written instructions given to you at your visit today.   PREP:   . Please pick up your prep supplies at the pharmacy within the next 1-3 days.  INHALERS:   . If you use inhalers (even only as needed), please bring them with you on the day of your procedure.  COLONOSCOPY TIPS:  . To reduce nausea and dehydration, stay well hydrated for 3-4 days prior to the exam.  . To prevent skin/hemorrhoid irritation - prior to wiping, put A&Dointment or vaseline on the toilet paper. Marland Kitchen Keep a towel or pad on the bed.  Marland Kitchen BEFORE STARTING YOUR PREP, drink  64oz of clear liquids in the morning. This will help to flush the colon and will ensure you are well hydrated!!!!  NOTE - This is in addition to the fluids required for to complete your prep. . Use of a flavored hard candy, such as grape Anise Salvo, can counteract some of the flavor of the prep and may prevent some nausea.   BMI:  . If you are age 65 or younger, your body mass index should be between 19-25. Your Body mass index is 24.9 kg/m. If this is out of the aformentioned range listed, please consider follow up with your Primary Care Provider.   MY CHART:  The [place] [facility] providers would like to encourage you to use [facility] to communicate with providers for non-urgent requests or questions.  Due to long hold times on the telephone, sending your provider a message by [facility] may be a faster and more efficient way to get a response.  Please allow 48 business hours for a response.  Please remember that this is for non-urgent requests.   FOLLOW UP:  After your procedure, you will receive a call from my office staff regarding my  recommendation for follow up.  Thank you for trusting me with your gastrointestinal care!    Henry L. Dorothea Glassman, MD

## 2020-09-18 NOTE — Progress Notes (Signed)
[place] [facility] Progress Note  Chief Complaint: Abnormal CT scan  Subjective  History: I last saw Kristin Ramsey for a screening colonoscopy in February 2021, at which time she had 3 polyps, 1 each SSP, TA, hyperplastic. We received a message from her PCP recently asking for review and recommendations after a CT scan was done for abdominal pain and suggested possible polyp in the left colon.  Kristin Ramsey says she has had IBS with constipation for years, and she has been taking MiraLAX daily for about 6 months.  However, this gives her semiformed to loose stool a couple of times a day.  About 2 months ago she had a change where she noticed intermittent crampy lower abdominal pain and visible abdominal distention.  When the pain and distention occur, she does not get nausea vomiting, there is been no rectal bleeding.  Her mother died several months ago, and she has been traveling back and forth a lot to [place] to help care for her father, and this is also led to a change in her work, which she takes with her and does somewhat remotely.  ROS: Cardiovascular:  no chest pain Respiratory: no dyspnea Admittedly stressed from the changes of the last several months, mourning the loss of her mom, but mood reportedly stable. 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 The patient's Past Medical, Family and Social History were reviewed and are on file in the EMR.  Objective:  Med list reviewed  Current Outpatient Medications:  .  escitalopram (LEXAPRO) 10 MG tablet, Take 10 mg by mouth every evening. , Disp: , Rfl:  .  estradiol (VIVELLE-DOT) 0.05 MG/24HR patch, Place 1 patch onto the skin 2 (two) times a week., Disp: , Rfl:  .  fluticasone (FLONASE) 50 MCG/ACT nasal spray, Place 1 spray into both nostrils daily., Disp: , Rfl:  .  hydrochlorothiazide (HYDRODIURIL) 12.5 MG tablet, Take 12.5 mg by mouth daily. , Disp: , Rfl: 2 .  lactase (LACTAID) 3000 units tablet, Take 3,000  Units by mouth 3 (three) times daily as needed (prior to dairy consumption)., Disp: , Rfl:  .  LUMIFY 0.025 % SOLN, Place 1 drop into both eyes daily., Disp: , Rfl:  .  metoprolol succinate (TOPROL-XL) 50 MG 24 hr tablet, Take 50 mg by mouth daily. , Disp: , Rfl: 0 .  OXcarbazepine ER (OXTELLAR XR) 300 MG TB24, Take 1 tablet by mouth at bedtime., Disp: 90 tablet, Rfl: 4 .  polyethylene glycol powder (GLYCOLAX/MIRALAX) 17 GM/SCOOP powder, Take 17 g by mouth daily. 1/2 or 1 scoop, Disp: , Rfl:  .  pregabalin (LYRICA) 100 MG capsule, Take 1 capsule (100 mg total) by mouth 3 (three) times daily., Disp: 270 capsule, Rfl: 4  No change in her medication regimen for a long time.  Vital signs in last 24 hrs: Vitals:   09/18/20 0820  BP: 130/90  Pulse: (!) 59   Wt Readings from Last 3 Encounters:  09/18/20 159 lb (72.1 kg)  03/26/20 158 lb 8 oz (71.9 kg)  09/04/19 155 lb 8 oz (70.5 kg)    Physical Exam  Well-appearing  HEENT: sclera anicteric, oral mucosa moist without lesions  Neck: supple, no thyromegaly, JVD or lymphadenopathy  Cardiac: RRR without murmurs, S1S2 heard, no peripheral edema  Pulm: clear to auscultation bilaterally, normal RR and effort noted  Abdomen: soft, mild LLQ tenderness, with active bowel sounds. No guarding or palpable hepatosplenomegaly.  Skin; warm and dry, no jaundice or rash  Labs:   ___________________________________________ Radiologic studies:  CLINICAL DATA:  Abdomen pain   EXAM: CT ABDOMEN AND PELVIS WITHOUT CONTRAST   TECHNIQUE: Multidetector CT imaging of the abdomen and pelvis was performed following the standard protocol without oral or IV contrast.   COMPARISON:  CT pelvis December 12, 2004   FINDINGS: Lower chest: Lung bases are clear.   Hepatobiliary: No focal liver lesions are appreciable on this noncontrast enhanced study. Gallbladder wall is not appreciably thickened. There is no biliary duct dilatation.   Pancreas: There is  no pancreatic mass or inflammatory focus.   Spleen: No splenic lesions are evident.   Adrenals/Urinary Tract: Adrenals bilaterally appear normal. No evident renal mass or hydronephrosis on either side. There is no appreciable renal or ureteral calculus on either side. Urinary bladder is largely decompressed without overt wall thickening.   Stomach/Bowel: Rectum mildly distended with stool and air. There is no appreciable bowel wall or mesenteric thickening. Moderate air is noted throughout much of the sigmoid colon. The sigmoid colon is redundant in appearance. There is noted a suspected sessile polyp in the sigmoid colon measuring 1.2 x 0.9 cm, best seen on coronal slice 21 series 5. This finding is also apparent on axial slice 30 series 2.   No bowel obstruction is appreciable. Terminal ileum appears normal. Appendix appears normal. No free air or portal venous air evident.   Vascular/Lymphatic: No abdominal aortic aneurysm. No vascular lesions are appreciable on noncontrast enhanced study. A circumaortic left renal vein is noted incidentally. There is no adenopathy in the abdomen or pelvis.   Reproductive: Uterus absent.  No adnexal masses are appreciable.   Other: No evident abscess or ascites in the abdomen or pelvis. There is mild fat in the umbilicus.   Musculoskeletal: No blastic or lytic bone lesions. No intramuscular or abdominal wall lesions are evident.   IMPRESSION: 1. No bowel obstruction or appreciable bowel wall thickening. Redundant sigmoid colon noted with sigmoid colon extending to the level of the upper abdomen midline anteriorly. Mild dilatation of portions of sigmoid colon may represent a degree of colonic ileus.   2. There is a soft tissue focus within this redundant air-filled sigmoid colon measuring 1.2 x 0.9 cm which may represent a localized sessile polyp. Note that a focus of stool could present similarly. The focal nature of this opacity with no  stool surrounding is suggestive of a focal polyp. This appearance is sufficiently suspicious to warrant direct visualization nonemergently after appropriate colonic cleansing.   3. No evident abscess in the abdomen or pelvis. Appendix appears normal.   4. No renal or ureteral calculus. No hydronephrosis. Urinary bladder wall thickness normal.   5.  Uterus absent.   These results will be called to the ordering clinician or representative by the Radiology Department at the imaging location.     Electronically Signed   By: Lowella Grip III M.D.   On: 08/30/2020 17:52  ____________________________________________ Other:   _____________________________________________ Assessment & Plan  Assessment: Encounter Diagnoses  Name Primary?  . Lower abdominal pain Yes  . Abnormal finding on GI tract imaging   . Chronic constipation   . Abdominal bloating    Chronic IBS-C, now 2 months of newer symptoms with abdominal pain and visible distention.  CT scan showing possible sigmoid polyp or stool.  Would be unusual for a polyp at that size to develop since last colonoscopy, there could have been a polyp not apparent on the last exam and has grown since  then.  Even if so, would not be causing the symptoms.  No evidence obstruction on CT.  No diverticulitis or areas of inflammation. Possible flare of her IBS symptoms with stresses and change in schedule over the last few months.  She was concerned because her mother had a widespread intra-abdominal/pelvic malignancy and then ultimately died when brain cancer was discovered months later.  Fortunately, no evidence of malignancy on the CT scan, and I do not think the CT scan finding is malignant. No apparent risk factors for SIBO. Plan: Stop MiraLAX and instead try Linzess 72 mcg, increasing to 145 mcg daily if needed.  Colonoscopy.  She was agreeable after discussion of procedure and risks.  The benefits and risks of the planned procedure  were described in detail with the patient or (when appropriate) their health care proxy.  Risks were outlined as including, but not limited to, bleeding, infection, perforation, adverse medication reaction leading to cardiac or pulmonary decompensation, pancreatitis (if ERCP).  The limitation of incomplete mucosal visualization was also discussed.  No guarantees or warranties were given.  Given her work and family obligations, she has elected to do it in July.  If no improvement with above, and colonoscopy unremarkable, probable trial of rifaximin.   30 minutes were spent on this encounter (including chart review, history/exam, counseling/coordination of care, and documentation) > 50% of that time was spent on counseling and coordination of care.  Topics discussed included: See above.  Nelida Meuse III

## 2020-10-10 ENCOUNTER — Telehealth: Payer: Self-pay | Admitting: Gastroenterology

## 2020-10-10 ENCOUNTER — Other Ambulatory Visit: Payer: Self-pay

## 2020-10-10 DIAGNOSIS — K5909 Other constipation: Secondary | ICD-10-CM

## 2020-10-10 MED ORDER — LINACLOTIDE 72 MCG PO CAPS: 72.0000 ug | ORAL_CAPSULE | Freq: Every day | ORAL | 2 refills | Status: DC

## 2020-10-10 NOTE — Telephone Encounter (Signed)
Outpatient Medication Detail   Disp Refills Start End   linaclotide (LINZESS) 72 MCG capsule 30 capsule 2 10/10/2020    Sig - Route: Take 1 capsule (72 mcg total) by mouth daily before breakfast. - Oral   Sent to pharmacy as: linaclotide Rolan Lipa) 72 MCG capsule   E-Prescribing Status: Receipt confirmed by pharmacy (10/10/2020  1:15 PM EDT)

## 2020-10-10 NOTE — Telephone Encounter (Signed)
Inbound call from patient. States she have finished the samples for linzess and would like a prescription sent to [facility] on [address] in [place]. States they helped out so much and she started feeling better within 2-3 days. Best contact number [phone].

## 2020-10-17 ENCOUNTER — Other Ambulatory Visit: Payer: Self-pay | Admitting: Neurology

## 2020-10-18 ENCOUNTER — Telehealth: Payer: Self-pay | Admitting: Neurology

## 2020-10-18 ENCOUNTER — Other Ambulatory Visit: Payer: Self-pay | Admitting: Neurology

## 2020-10-18 NOTE — Telephone Encounter (Signed)
Message from answering service that pregabalin had not been sent in.   I called patient and sent in the refill

## 2020-10-18 NOTE — Telephone Encounter (Signed)
Pt called checking status of refill for pregabalin (LYRICA) 100 MG capsule took last dose today. Would like a call from the nurse when refill has been sent.

## 2020-10-18 NOTE — Telephone Encounter (Signed)
Refill for Lyrica was sent to MD for approval.

## 2020-10-19 NOTE — Telephone Encounter (Signed)
Meds ordered this encounter  Medications   pregabalin (LYRICA) 100 MG capsule    Sig: TAKE 1 CAPSULE BY MOUTH THREE TIMES A DAY    Dispense:  270 capsule    Refill:  1    This request is for a new prescription for a controlled substance as required by Federal/State law.

## 2020-10-23 ENCOUNTER — Encounter: Payer: Self-pay | Admitting: Gastroenterology

## 2020-10-30 ENCOUNTER — Other Ambulatory Visit: Payer: Self-pay

## 2020-10-30 ENCOUNTER — Ambulatory Visit (AMBULATORY_SURGERY_CENTER): Payer: 59 | Admitting: Gastroenterology

## 2020-10-30 ENCOUNTER — Encounter: Payer: Self-pay | Admitting: Gastroenterology

## 2020-10-30 VITALS — BP 141/73 | HR 61 | Temp 97.3°F | Resp 12 | Ht 67.0 in | Wt 159.0 lb

## 2020-10-30 DIAGNOSIS — K5909 Other constipation: Secondary | ICD-10-CM

## 2020-10-30 DIAGNOSIS — R1030 Lower abdominal pain, unspecified: Secondary | ICD-10-CM

## 2020-10-30 DIAGNOSIS — R933 Abnormal findings on diagnostic imaging of other parts of digestive tract: Secondary | ICD-10-CM

## 2020-10-30 DIAGNOSIS — Z1211 Encounter for screening for malignant neoplasm of colon: Secondary | ICD-10-CM | POA: Diagnosis not present

## 2020-10-30 MED ORDER — SODIUM CHLORIDE 0.9 % IV SOLN: 500.0000 mL | Freq: Once | INTRAVENOUS | Status: DC

## 2020-10-30 NOTE — Progress Notes (Signed)
A and O x3. Report to RN. Tolerated MAC anesthesia well. 

## 2020-10-30 NOTE — Progress Notes (Signed)
Pt's states no medical or surgical changes since previsit or office visit. 

## 2020-10-30 NOTE — Op Note (Signed)
[place] Patient Name: Kristin Ramsey Procedure Date: 10/30/2020 1:36 PM MRN: [id number] Endoscopist: Mallie Mussel L. Loletha Carrow , MD Age: 60 Referring MD:  Date of Birth: 02-24-1961 Gender: Female Account #: [id number] Procedure:                Colonoscopy Indications:              Lower abdominal pain, Abnormal CT of the GI tract                            (suggesting polypoid abnormality in the left                            colon), Constipation                           Patient reports significant symptom improvement on                            Linzess 72 mcg once daily Medicines:                Monitored Anesthesia Care Procedure:                Pre-Anesthesia Assessment:                           - Prior to the procedure, a History and Physical                            was performed, and patient medications and                            allergies were reviewed. The patient's tolerance of                            previous anesthesia was also reviewed. The risks                            and benefits of the procedure and the sedation                            options and risks were discussed with the patient.                            All questions were answered, and informed consent                            was obtained. Prior Anticoagulants: The patient has                            taken no previous anticoagulant or antiplatelet                            agents. ASA Grade Assessment: II - A patient with  mild systemic disease. After reviewing the risks                            and benefits, the patient was deemed in                            satisfactory condition to undergo the procedure.                           After obtaining informed consent, the colonoscope                            was passed under direct vision. Throughout the                            procedure, the patient's blood pressure, pulse, and                             oxygen saturations were monitored continuously. The                            CF [id number] #[id number] was introduced through the anus                            and advanced to the the cecum, identified by                            appendiceal orifice and ileocecal valve. The                            colonoscopy was performed with difficulty due to a                            redundant colon and significant looping. Successful                            completion of the procedure was aided by changing                            the patient to a supine position, changing the                            patient to a semi-prone position and using manual                            pressure. The patient tolerated the procedure well.                            The quality of the bowel preparation was good after                            lavage. The ileocecal valve, appendiceal orifice,  and rectum were photographed. Scope In: 1:52:13 PM Scope Out: 2:20:02 PM Scope Withdrawal Time: 0 hours 11 minutes 16 seconds  Total Procedure Duration: 0 hours 27 minutes 49 seconds  Findings:                 The perianal and digital rectal examinations were                            normal.                           The colon (entire examined portion) was redundant,                            a new finding since last colonoscopy Feb 2021.                           The exam was otherwise without abnormality on                            direct and retroflexion views. Complications:            No immediate complications. Estimated Blood Loss:     Estimated blood loss: none. Impression:               - Redundant colon.                           - The examination was otherwise normal on direct                            and retroflexion views.                           - No specimens collected. Recommendation:           - Patient has a contact number available for                             emergencies. The signs and symptoms of potential                            delayed complications were discussed with the                            patient. Return to normal activities tomorrow.                            Written discharge instructions were provided to the                            patient.                           - Resume previous diet.                           - Continue present medications.                           -  Repeat colonoscopy in 10 years for screening                            purposes.                           - Return to my office PRN. Niccolas Loeper L. Loletha Carrow, MD 10/30/2020 2:24:45 PM This report has been signed electronically.

## 2020-10-30 NOTE — Patient Instructions (Signed)
Please read handouts provided. Continue present medications. Repeat colonoscopy in 10 years for screening purposes. Return to Dr. Loletha Carrow as needed.    YOU HAD AN ENDOSCOPIC PROCEDURE TODAY AT [facility]:   Refer to the procedure report that was given to you for any specific questions about what was found during the examination.  If the procedure report does not answer your questions, please call your gastroenterologist to clarify.  If you requested that your care partner not be given the details of your procedure findings, then the procedure report has been included in a sealed envelope for you to review at your convenience later.  YOU SHOULD EXPECT: Some feelings of bloating in the abdomen. Passage of more gas than usual.  Walking can help get rid of the air that was put into your GI tract during the procedure and reduce the bloating. If you had a lower endoscopy (such as a colonoscopy or flexible sigmoidoscopy) you may notice spotting of blood in your stool or on the toilet paper. If you underwent a bowel prep for your procedure, you may not have a normal bowel movement for a few days.  Please Note:  You might notice some irritation and congestion in your nose or some drainage.  This is from the oxygen used during your procedure.  There is no need for concern and it should clear up in a day or so.  SYMPTOMS TO REPORT IMMEDIATELY:  Following lower endoscopy (colonoscopy or flexible sigmoidoscopy):  Excessive amounts of blood in the stool  Significant tenderness or worsening of abdominal pains  Swelling of the abdomen that is new, acute  Fever of 100F or higher   For urgent or emergent issues, a gastroenterologist can be reached at any hour by calling [phone]. Do not use MyChart messaging for urgent concerns.    DIET:  We do recommend a small meal at first, but then you may proceed to your regular diet.  Drink plenty of fluids but you should avoid alcoholic  beverages for 24 hours.  ACTIVITY:  You should plan to take it easy for the rest of today and you should NOT DRIVE or use heavy machinery until tomorrow (because of the sedation medicines used during the test).    FOLLOW UP: Our staff will call the number listed on your records 48-72 hours following your procedure to check on you and address any questions or concerns that you may have regarding the information given to you following your procedure. If we do not reach you, we will leave a message.  We will attempt to reach you two times.  During this call, we will ask if you have developed any symptoms of COVID 19. If you develop any symptoms (ie: fever, flu-like symptoms, shortness of breath, cough etc.) before then, please call [phone].  If you test positive for Covid 19 in the 2 weeks post procedure, please call and report this information to [place].    If any biopsies were taken you will be contacted by phone or by letter within the next 1-3 weeks.  Please call us at [phone] if you have not heard about the biopsies in 3 weeks.    SIGNATURES/CONFIDENTIALITY: You and/or your care partner have signed paperwork which will be entered into your electronic medical record.  These signatures attest to the fact that that the information above on your After Visit Summary has been reviewed and is understood.  Full responsibility of the confidentiality of this discharge information lies  with you and/or your care-partner.  

## 2020-11-01 ENCOUNTER — Telehealth: Payer: Self-pay | Admitting: *Deleted

## 2020-11-01 NOTE — Telephone Encounter (Signed)
  Follow up Call-  Call back number 10/30/2020 06/13/2019  Post procedure Call Back phone  # [phone] -  Permission to leave phone message Yes Yes  Some recent data might be hidden     Patient questions:  Do you have a fever, pain , or abdominal swelling? No. Pain Score  0 *  Have you tolerated food without any problems? Yes.    Have you been able to return to your normal activities? Yes.    Do you have any questions about your discharge instructions: Diet   No. Medications  No. Follow up visit  No.  Do you have questions or concerns about your Care? No.  Actions: * If pain score is 4 or above: No action needed, pain <4.Have you developed a fever since your procedure? no  2.   Have you had an respiratory symptoms (SOB or cough) since your procedure? no  3.   Have you tested positive for COVID 19 since your procedure no  4.   Have you had any family members/close contacts diagnosed with the COVID 19 since your procedure?  no   If yes to any of these questions please route to Joylene John, RN and Joella Prince, RN

## 2020-11-04 ENCOUNTER — Telehealth: Payer: Self-pay | Admitting: Gastroenterology

## 2020-11-04 NOTE — Telephone Encounter (Signed)
Inbound call from patient. Had procedure 7/13. States she is still bloating and have not had a bowel movement. Asking if someone could call her back to see what can be done to help. Best contact number [phone]

## 2020-11-04 NOTE — Telephone Encounter (Signed)
Spoke with patient, she states that she had a colonoscopy on 10/30/20 but she still has not had a BM. Advised patient that it can take a while for her bowels to return to normal after a bowel prep. Advised patient that she can add Miralax up to 3 times a day and see if that helps her have a bowel movement. Patient states that she is currently on Linzess 72 mcg as well, she has been advised to continue. Advised patient that she is not passing much gas, advised that she can try Gas-X OTC. Advised patient that she can try this regimen for a few days to see if she gets any relief. Patient states that she is traveling and thinks she has a "shy gut."Advised patient that if she does not have any relief or develops pain she needs to call us and let us know. Patient verbalized understanding and had no concerns at the end of the call.

## 2020-11-27 IMAGING — CR DG ABDOMEN 2V
2 series · 2 of 2 positions shown · non-contrast
Comparison: None.

CLINICAL DATA: Upper abdominal pain and constipation

EXAM:
ABDOMEN - 2 VIEW

[w abdomen upright]
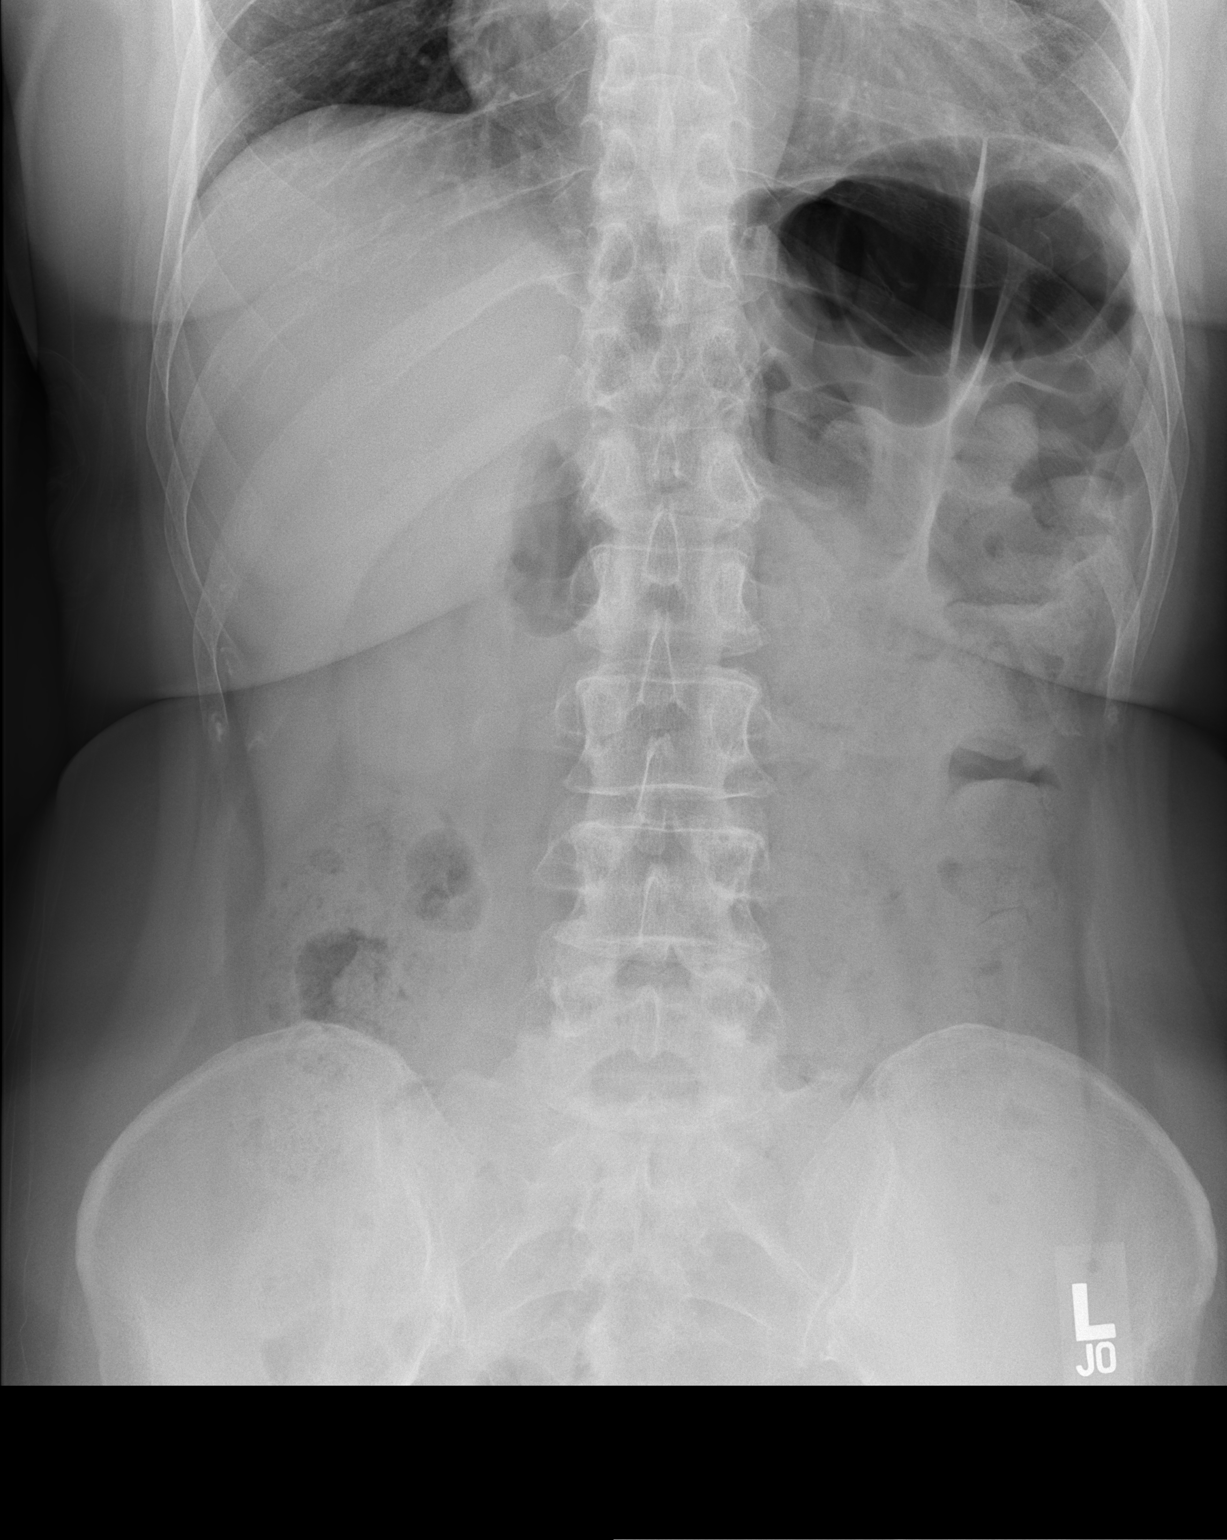

[t abdomen supine]
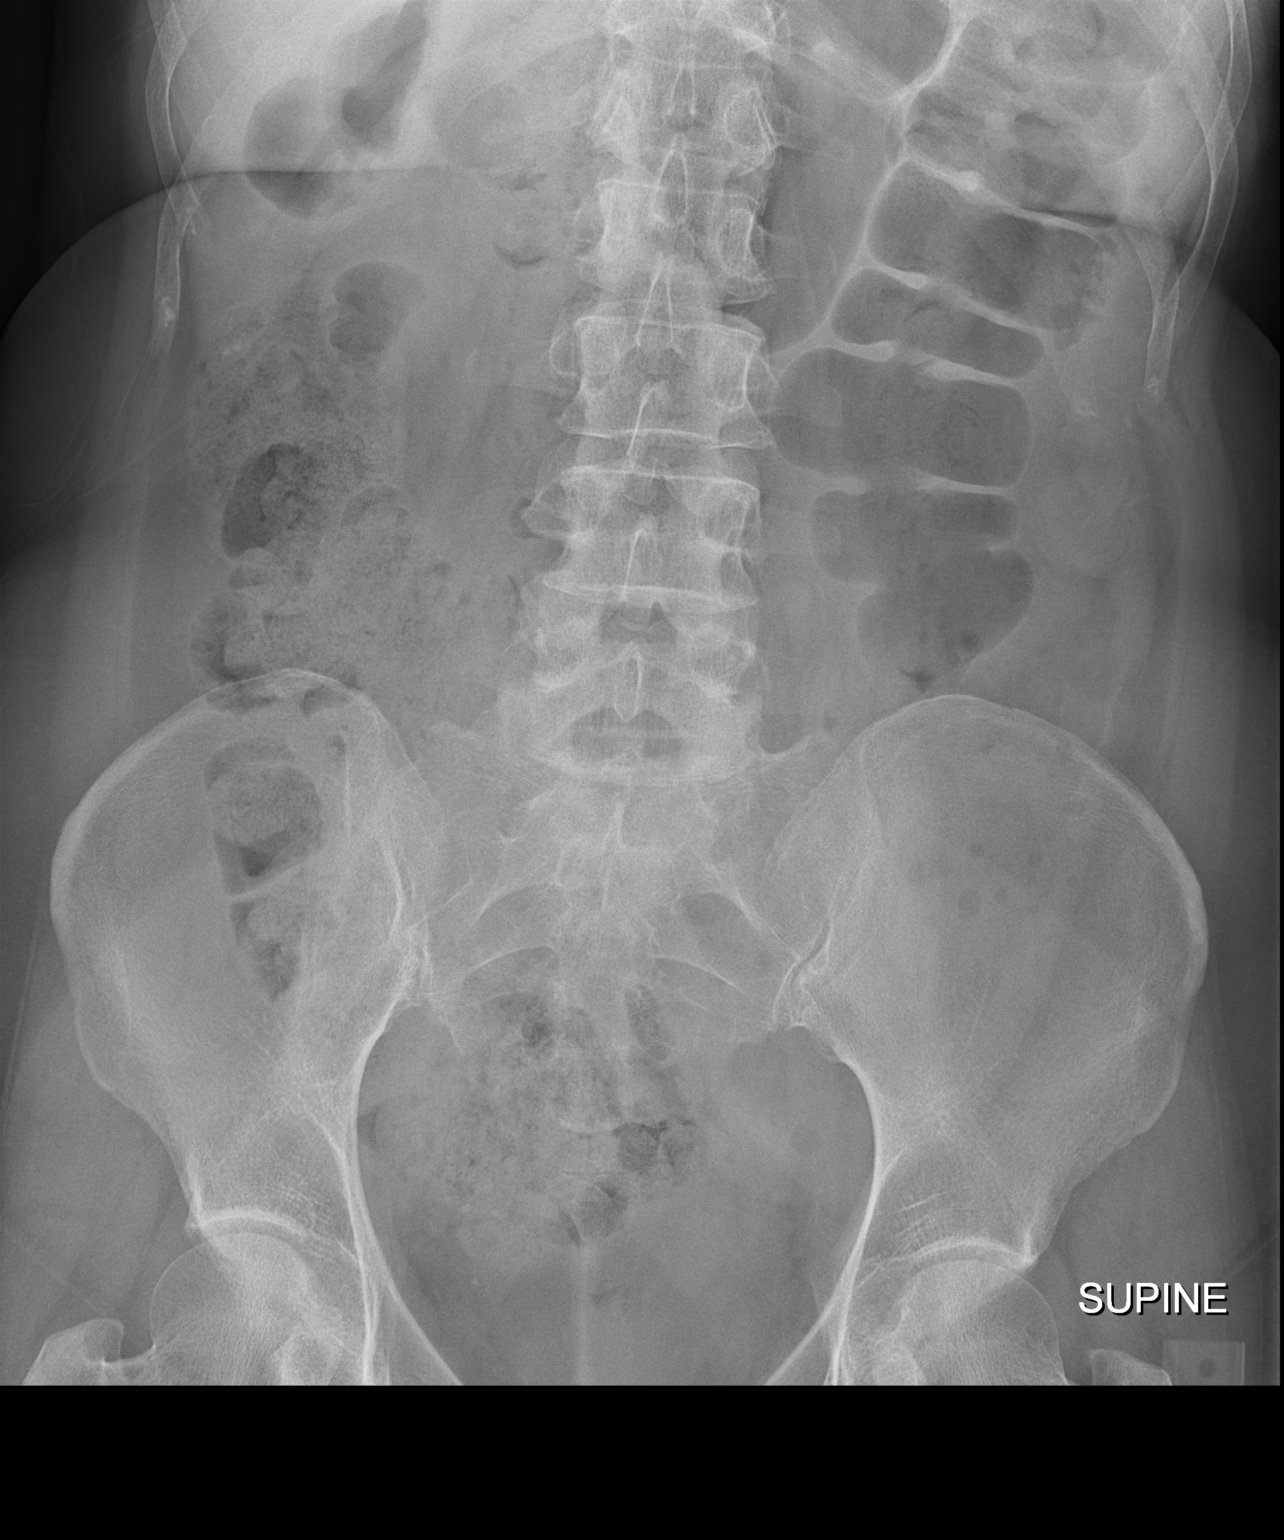

[2 of 2 positions shown; findings below may reference images not displayed]

FINDINGS: Supine and upright images were obtained. There is diffuse stool
throughout much of the colon. There is no bowel dilatation or
air-fluid level to suggest bowel obstruction. No free air. There are
probable small phleboliths in the pelvis. Lung bases are clear.
IMPRESSION: Diffuse stool throughout much of colon, a finding that may be
indicative of a degree of constipation. No bowel obstruction or free
air evident. Visualized lung bases are clear.

## 2021-01-08 ENCOUNTER — Other Ambulatory Visit: Payer: Self-pay | Admitting: Neurology

## 2021-01-08 MED ORDER — PREGABALIN 100 MG PO CAPS: ORAL_CAPSULE | ORAL | 1 refills | Status: DC

## 2021-01-08 NOTE — Telephone Encounter (Signed)
Pt called requesting refill for pregabalin (LYRICA) 100 MG capsule. She is going out of town on Friday. Pharmacy [id number].

## 2021-01-21 ENCOUNTER — Other Ambulatory Visit: Payer: Self-pay | Admitting: *Deleted

## 2021-01-21 NOTE — Patient Instructions (Signed)
Goals Addressed             This Visit's Progress    Patient agreed to 6 month educational mailing       [id number] RN sent patient educational material on hypertension.      Track and Manage My Blood Pressure-Hypertension       Timeframe:  Long-Range Goal Priority:  High Start Date:    15056979                        Expected End Date:      [id number]                 Follow Up Date [id number]   - check blood pressure weekly - choose a place to take my blood pressure (home, clinic or office, retail store) - write blood pressure results in a log or diary    Why is this important?   You won't feel high blood pressure, but it can still hurt your blood vessels.  High blood pressure can cause heart or kidney problems. It can also cause a stroke.  Making lifestyle changes like losing a little weight or eating less salt will help.  Checking your blood pressure at home and at different times of the day can help to control blood pressure.  If the doctor prescribes medicine remember to take it the way the doctor ordered.  Call the office if you cannot afford the medicine or if there are questions about it.     Notes:  [id number] Per patient her blood pressure is controlled

## 2021-01-22 NOTE — Patient Outreach (Signed)
[facility]) Care Management  01/22/2021  Kristin Ramsey 1960-12-11 [id number]   RN Health Coach telephone call to patient.  [employer] compliance verified. Per patient she is doing good. She is monitoring her blood pressure. Per patient her blood pressure is controlled. She is taking medications as per ordered. She is able to afford her medications. She has transportation and is able to keep her appointments. Patient has declined the services of [facility] for a nurse, social worker and pharmacy. Patient has agreed for mailing of 6 month educational material.   Plan: RN will send 6 month educational material  [facility] [phone]

## 2021-02-03 ENCOUNTER — Other Ambulatory Visit: Payer: Self-pay | Admitting: Gastroenterology

## 2021-02-03 DIAGNOSIS — K5909 Other constipation: Secondary | ICD-10-CM

## 2021-02-11 DIAGNOSIS — Z23 Encounter for immunization: Secondary | ICD-10-CM | POA: Diagnosis not present

## 2021-03-27 ENCOUNTER — Ambulatory Visit: Payer: 59 | Admitting: Neurology

## 2021-04-11 ENCOUNTER — Other Ambulatory Visit: Payer: Self-pay | Admitting: Neurology

## 2021-04-15 NOTE — Telephone Encounter (Signed)
Rx refilled.

## 2021-04-16 DIAGNOSIS — Z1321 Encounter for screening for nutritional disorder: Secondary | ICD-10-CM | POA: Diagnosis not present

## 2021-04-16 DIAGNOSIS — Z13220 Encounter for screening for lipoid disorders: Secondary | ICD-10-CM | POA: Diagnosis not present

## 2021-04-16 DIAGNOSIS — Z6825 Body mass index (BMI) 25.0-25.9, adult: Secondary | ICD-10-CM | POA: Diagnosis not present

## 2021-04-16 DIAGNOSIS — Z1231 Encounter for screening mammogram for malignant neoplasm of breast: Secondary | ICD-10-CM | POA: Diagnosis not present

## 2021-04-16 DIAGNOSIS — Z01419 Encounter for gynecological examination (general) (routine) without abnormal findings: Secondary | ICD-10-CM | POA: Diagnosis not present

## 2021-05-01 DIAGNOSIS — R519 Headache, unspecified: Secondary | ICD-10-CM | POA: Diagnosis not present

## 2021-05-13 DIAGNOSIS — R519 Headache, unspecified: Secondary | ICD-10-CM | POA: Diagnosis not present

## 2021-05-13 DIAGNOSIS — M542 Cervicalgia: Secondary | ICD-10-CM | POA: Diagnosis not present

## 2021-05-13 DIAGNOSIS — R110 Nausea: Secondary | ICD-10-CM | POA: Diagnosis not present

## 2021-05-26 ENCOUNTER — Ambulatory Visit: Payer: 59 | Admitting: Neurology

## 2021-05-26 ENCOUNTER — Encounter: Payer: Self-pay | Admitting: Neurology

## 2021-05-26 VITALS — BP 136/88 | HR 60 | Ht 67.0 in | Wt 161.5 lb

## 2021-05-26 DIAGNOSIS — B0229 Other postherpetic nervous system involvement: Secondary | ICD-10-CM

## 2021-05-26 DIAGNOSIS — G43709 Chronic migraine without aura, not intractable, without status migrainosus: Secondary | ICD-10-CM

## 2021-05-26 MED ORDER — RIZATRIPTAN BENZOATE 10 MG PO TBDP: ORAL_TABLET | ORAL | 6 refills | Status: DC

## 2021-05-26 MED ORDER — TIZANIDINE HCL 4 MG PO TABS: 4.0000 mg | ORAL_TABLET | Freq: Four times a day (QID) | ORAL | 6 refills | Status: DC | PRN

## 2021-05-26 MED ORDER — ONDANSETRON HCL 4 MG PO TABS: 4.0000 mg | ORAL_TABLET | Freq: Three times a day (TID) | ORAL | 6 refills | Status: DC | PRN

## 2021-05-26 NOTE — Progress Notes (Signed)
[facility]  HISTORY OF PRESENT ILLNESS: Kristin Ramsey is a 60 year old female, accompanied by her husband, seen in refer by  Cari Caraway, for evaluation of postherpetic neuralgia, initial evaluation was on May 10, 2017.   I reviewed and summarized the referring note, she had a past medical history of major depression, hiatal hernia, she did reported a history of chickenpox in the past,   She has history of chronic migraine, on April 09, 2017, she developed right forehead retro-orbital area headaches, she thought it was her usual migraine headaches, but she had persistent severe pain, went to the emergency room on April 10, 2017, cocktail treatment did not help her headache.   On April 11, 2017, she noticed blisters breaking out at the right forehead region, also noticed right eye light sensitivity, tearing, right upper eyelid swollen, presented to the emergency room on April 13, 2017, was diagnosis with herpes, was given valacyclovir 1 g 3 times a day for 2 weeks,   She was seen by Dr. Carolynn Sayers on April 16, 2017, later in January 2019, she was found to have right corneal involvement, received eyedrops ofloxacin 0.3%, Keflex 500 mg twice a day for 10 days,   Now she has persistent right eye sensitivity, tearing, was not able to go back to work, also right forehead region itching, burning, hypersensitivity,   She only took prescribed gabapentin 300 mg twice, does help her sleep, but complains of drowsiness, she also complains of worsening depression anxiety.   UPDATE Jun 10 2017: She has never tried Trileptal 150 mg twice a day as prescribed, worried about the side effect, overall her symptoms has improved, she is no longer has constant right eye tearing, but still has right eye foreign object sensation, no loss of vision, was seen by her ophthalmologist recently, was told she is healing well, she continue has mild right upper eyelid swelling, right  forehead skin discoloration, swelling, she is taking gabapentin 300 mg up to 4-5 tablets each day, right forehead region neuropathic pain, complains of fatigue, drowsiness, on short-term disability.  Also taking clonazepam 0.5 mg every night to help the itching and sleep,  UPDATE Sep 04 2019: She is not taking gabapentin 300 mg 6 tablets a day, Oxtellar 300 mg every night, continue complains of significant sensitivity at the right V1 distribution, exacerbated by touching, washing her hair, excessive sun exposure, right eye sensitivity has much improved, there was no significant right visual loss, she is continue follow-up by her ophthalmologist  Laboratory evaluation in July 2020 showed normal BMP, sodium of 138, CBC, hemoglobin of 12.8.  UPDATE Mar 26 2020: She is very happy with current combination, Lyrica 100 mg 3 times a day, she has decreased Oxtellar to 300 mg at bedtime, she tolerated the medication well, no apparent side effect, occasional weakness in the right V1 territory  UPDATE Feb 6th 2023: She continues to have right V2, V1 area skin sensitivity, continue with Oxtellar 300 mg every night, it does help her sleep, she could not tolerate sleeping on her right side, complains of right side skin sensitivity,  She had long history of migraine headaches, right lateralized severe pounding headache with light noise sensitivity, nauseous, lasting couple hours or longer, has not had recurrent for a while  She went through very stressful 2022, she lost her mother, just moved her father to assisted living, since January 2023, she began to have intermittent migraine headache, had 2 major prolonged migraine, Imitrex 100 mg as needed  provided help most of the time, 34, [place] so even longer for [place] to take effective, sleep always helpful, she also complains tension in her right neck,  REVIEW OF SYSTEMS: Full 14 system review of systems performed and notable only for those listed, all others are neg:   As above   ALLERGIES: Allergies  Allergen Reactions   Erythromycin Nausea And Vomiting and Other (See Comments)    Abdominal pain.   Latex Itching   Nitrous Oxide Nausea And Vomiting   Other Nausea Only    Anesthesia   Sulfamethoxazole-Trimethoprim Other (See Comments)    Fatigue/depression (lack of physical and emotional energy)    HOME MEDICATIONS: Outpatient Medications Prior to Visit  Medication Sig Dispense Refill   escitalopram (LEXAPRO) 10 MG tablet Take 10 mg by mouth every evening.      estradiol (VIVELLE-DOT) 0.05 MG/24HR patch Place 1 patch onto the skin 2 (two) times a week.     fluticasone (FLONASE) 50 MCG/ACT nasal spray Place 1 spray into both nostrils daily.     hydrochlorothiazide (HYDRODIURIL) 12.5 MG tablet Take 12.5 mg by mouth daily.   2   lactase (LACTAID) 3000 units tablet Take 3,000 Units by mouth 3 (three) times daily as needed (prior to dairy consumption).     LINZESS 72 MCG capsule TAKE 1 CAPSULE BY MOUTH DAILY BEFORE BREAKFAST. 30 capsule 2   LUMIFY 0.025 % SOLN Place 1 drop into both eyes daily.     metoprolol succinate (TOPROL-XL) 50 MG 24 hr tablet Take 50 mg by mouth daily.   0   OXTELLAR XR 300 MG TB24 TAKE 1 TABLET BY MOUTH EVERYDAY AT BEDTIME 90 tablet 4   pregabalin (LYRICA) 100 MG capsule TAKE 1 CAPSULE BY MOUTH THREE TIMES A DAY 270 capsule 1   SUMAtriptan (IMITREX) 100 MG tablet Take 100 mg by mouth as needed.     polyethylene glycol powder (GLYCOLAX/MIRALAX) 17 GM/SCOOP powder Take 17 g by mouth daily. 1/2 or 1 scoop     No facility-administered medications prior to visit.    PAST MEDICAL HISTORY: Past Medical History:  Diagnosis Date   Anxiety    Arthritis    GERD (gastroesophageal reflux disease)    Hypertension    Interstitial cystitis    Shingles     PAST SURGICAL HISTORY: Past Surgical History:  Procedure Laterality Date   ABDOMINAL HYSTERECTOMY     COLONOSCOPY  2009   normal   HYSTEROSCOPY     LEFT HEART CATH AND  CORONARY ANGIOGRAPHY N/A 11/25/2018   Procedure: LEFT HEART CATH AND CORONARY ANGIOGRAPHY;  Surgeon: Nelva Bush, MD;  Location: [facility];  Service: Cardiovascular;  Laterality: N/A;   TONSILLECTOMY     UPPER GASTROINTESTINAL ENDOSCOPY  2009   hiatal hernia    FAMILY HISTORY: Family History  Problem Relation Age of Onset   Stomach cancer Mother    Crohn's disease Mother    Brain cancer Mother    Hypertension Father    Anxiety disorder Father    Macular degeneration Father    Prostate cancer Father    Colon polyps Father    Colon cancer Neg Hx    Esophageal cancer Neg Hx    Rectal cancer Neg Hx     SOCIAL HISTORY: Social History   Socioeconomic History   Marital status: Married    Spouse name: Not on file   Number of children: 1   Years of education: 16   Highest education level: Bachelor's  degree (e.g., BA, AB, BS)  Occupational History   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   Right-handed.   0.5 cup of coffee per day, occasional soda.   Social Determinants of Health   Financial Resource Strain: Not on file  Food Insecurity: No Food Insecurity   Worried About Charity fundraiser in the Last Year: Never true   Ran Out of Food in the Last Year: Never true  Transportation Needs: No Transportation Needs   Lack of Transportation (Medical): No   Lack of Transportation (Non-Medical): No  Physical Activity: Not on file  Stress: Not on file  Social Connections: Not on file  Intimate Partner Violence: Not on file     PHYSICAL EXAM  Vitals:   05/26/21 0752  BP: 136/88  Pulse:  60  Weight: 161 lb 8 oz (73.3 kg)  Height: 5\' 7"  (1.702 m)   Body mass index is 25.29 kg/m.  Generalized: Well developed, in no acute distress  Head: normocephalic and atraumatic,. Oropharynx benign  Neck: Supple,  Musculoskeletal: No deformity   Neurological examination   PHYSICAL EXAMNIATION:  Gen: NAD, conversant, well nourised, well groomed                     Cardiovascular: Regular rate rhythm, no peripheral edema, warm, nontender. Eyes: Conjunctivae clear without exudates or hemorrhage Neck: Supple, no carotid bruits. Pulmonary: Clear to auscultation bilaterally   NEUROLOGICAL EXAM:  MENTAL STATUS: Speech/cognition: Awake, alert, oriented to history taking care of conversation   CRANIAL NERVES: CN II: Visual fields are full to confrontation.  Pupils are round equal and briskly reactive to light. CN III, IV, VI: extraocular movement are normal. No ptosis. CN V: Right V1, V2 area skin sensitivity, corneal responses are intact.  CN VII: Face is symmetric with normal eye closure and smile. CN VIII: Hearing is normal to casual conversation CN IX, X: Palate elevates symmetrically. Phonation is normal. CN XI: Head turning and shoulder shrug are intact CN XII: Tongue is midline with normal movements and no atrophy.  Draped soft palate  MOTOR: There is no pronator drift of out-stretched arms. Muscle bulk and tone are normal. Muscle strength is normal.  REFLEXES: Reflexes are 2+ and symmetric at the biceps, triceps, knees, and ankles. Plantar responses are flexor.  SENSORY: Intact to light touch, pinprick, positional and vibratory sensation are intact in fingers and toes.  COORDINATION: Rapid alternating movements and fine finger movements are intact. There is no dysmetria on finger-to-nose and heel-knee-shin.    GAIT/STANCE: Posture is normal. Gait is steady with normal steps,     DIAGNOSTIC DATA (LABS, IMAGING, TESTING) - I reviewed patient records, labs, notes,  testing and imaging myself where available.  Lab Results  Component Value Date   WBC 6.0 09/04/2019   HGB 13.5 09/04/2019   HCT 40.5 09/04/2019   MCV 92 09/04/2019   PLT 210 11/02/2018      Component Value Date/Time   NA 139 09/04/2019 1348   K 5.2  09/04/2019 1348   CL 101 09/04/2019 1348   CO2 26 09/04/2019 1348   GLUCOSE 84 09/04/2019 1348   GLUCOSE 87 05/21/2009 1224   BUN 17 09/04/2019 1348   CREATININE 0.72 09/04/2019 1348   CALCIUM 9.3 09/04/2019 1348   PROT 6.8 09/04/2019 1348   ALBUMIN 4.3 09/04/2019 1348   AST 16 09/04/2019 1348   ALT 9 09/04/2019 1348   ALKPHOS 66 09/04/2019 1348   BILITOT 0.2 09/04/2019 1348   GFRNONAA 93 09/04/2019 1348   GFRAA 108 09/04/2019 1348    Lab Results  Component Value Date   VITAMINB12 290 05/21/2009   Lab Results  Component Value Date   TSH 1.490 09/04/2019   ASSESSMENT AND PLAN [place] involving right V1, mild involvement of right V2 in December 2018 Prolonged retractable postherpetic neuralgia Chronic migraine headaches At risk for obstructive sleep apnea  Keep Oxtellar to 300 mg 1 tablet every night  Suboptimal response to Imitrex 100 mg as migraine abortive treatment, will try Maxalt 10 mg, may combine with tizanidine Zofran for severe prolonged headaches,   She complains of excessive daytime fatigue, sleepiness, frequent snoring, occasionally wake up from snoring, will continue to observe her symptoms, if continues to have frequent occurrence, may consider sleep study refer               Marcial Pacas, M.D. Ph.D.  [facility] [place], [place] [address] Phone: [phone] Fax:      [phone]

## 2021-05-26 NOTE — Patient Instructions (Signed)
Meds ordered this encounter  Medications   rizatriptan (MAXALT-MLT) 10 MG disintegrating tablet    Sig: Take 1 tab at onset of migraine.  May repeat in 2 hrs, if needed.  Max dose: 2 tabs/day. This is a 30 day prescription.    Dispense:  10 tablet    Refill:  6   tiZANidine (ZANAFLEX) 4 MG tablet    Sig: Take 1 tablet (4 mg total) by mouth every 6 (six) hours as needed for muscle spasms.    Dispense:  30 tablet    Refill:  6   ondansetron (ZOFRAN) 4 MG tablet    Sig: Take 1 tablet (4 mg total) by mouth every 8 (eight) hours as needed for nausea or vomiting.    Dispense:  20 tablet    Refill:  6

## 2021-06-26 ENCOUNTER — Other Ambulatory Visit: Payer: Self-pay | Admitting: Gastroenterology

## 2021-06-26 DIAGNOSIS — K5909 Other constipation: Secondary | ICD-10-CM

## 2021-07-04 IMAGING — CR CHEST - 2 VIEW
2 series · 2 of 2 positions shown · non-contrast
Comparison: None.

CLINICAL DATA: Angina. Pre-op respiratory exam for cardiac
catheterization

EXAM:
CHEST - 2 VIEW

[w chest pa]
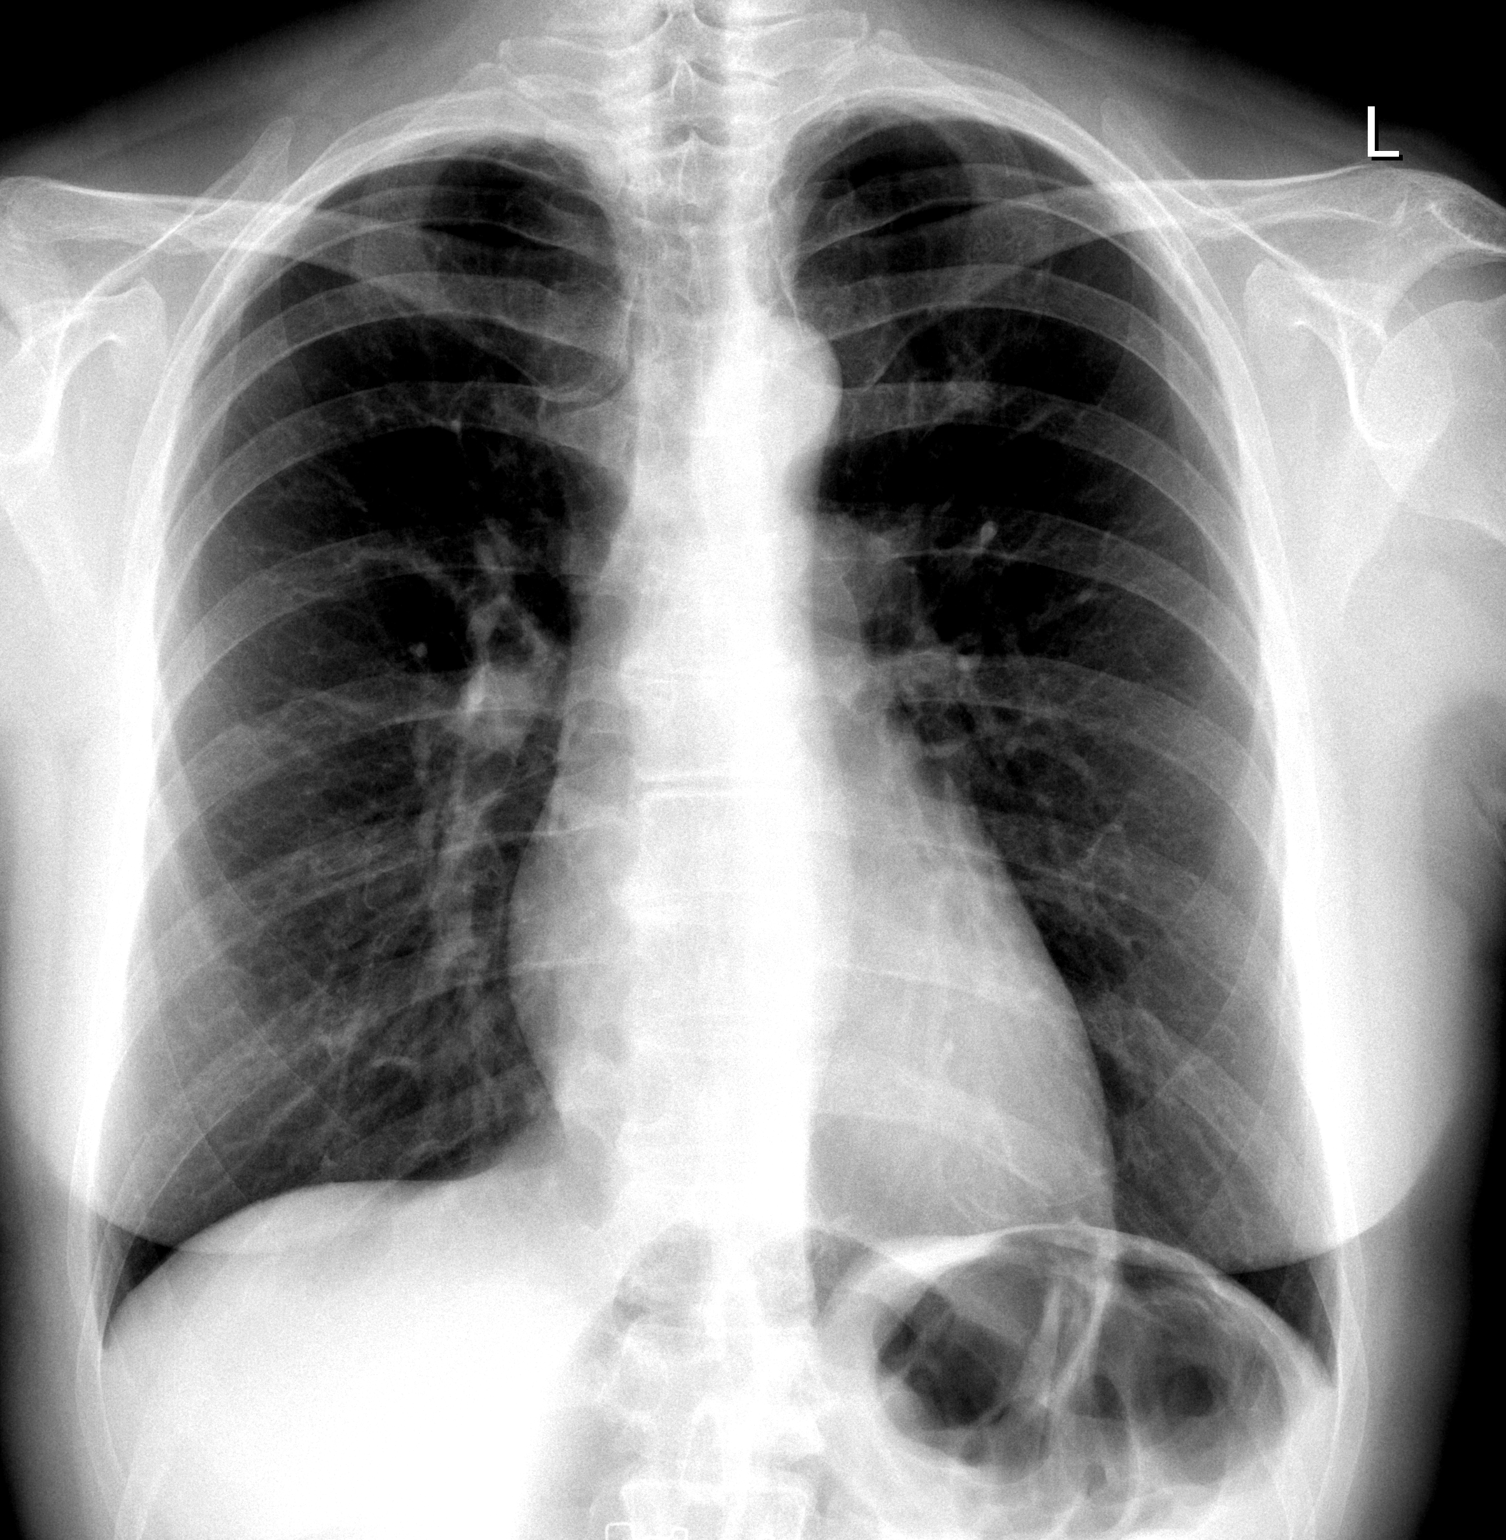

[w chest lat]
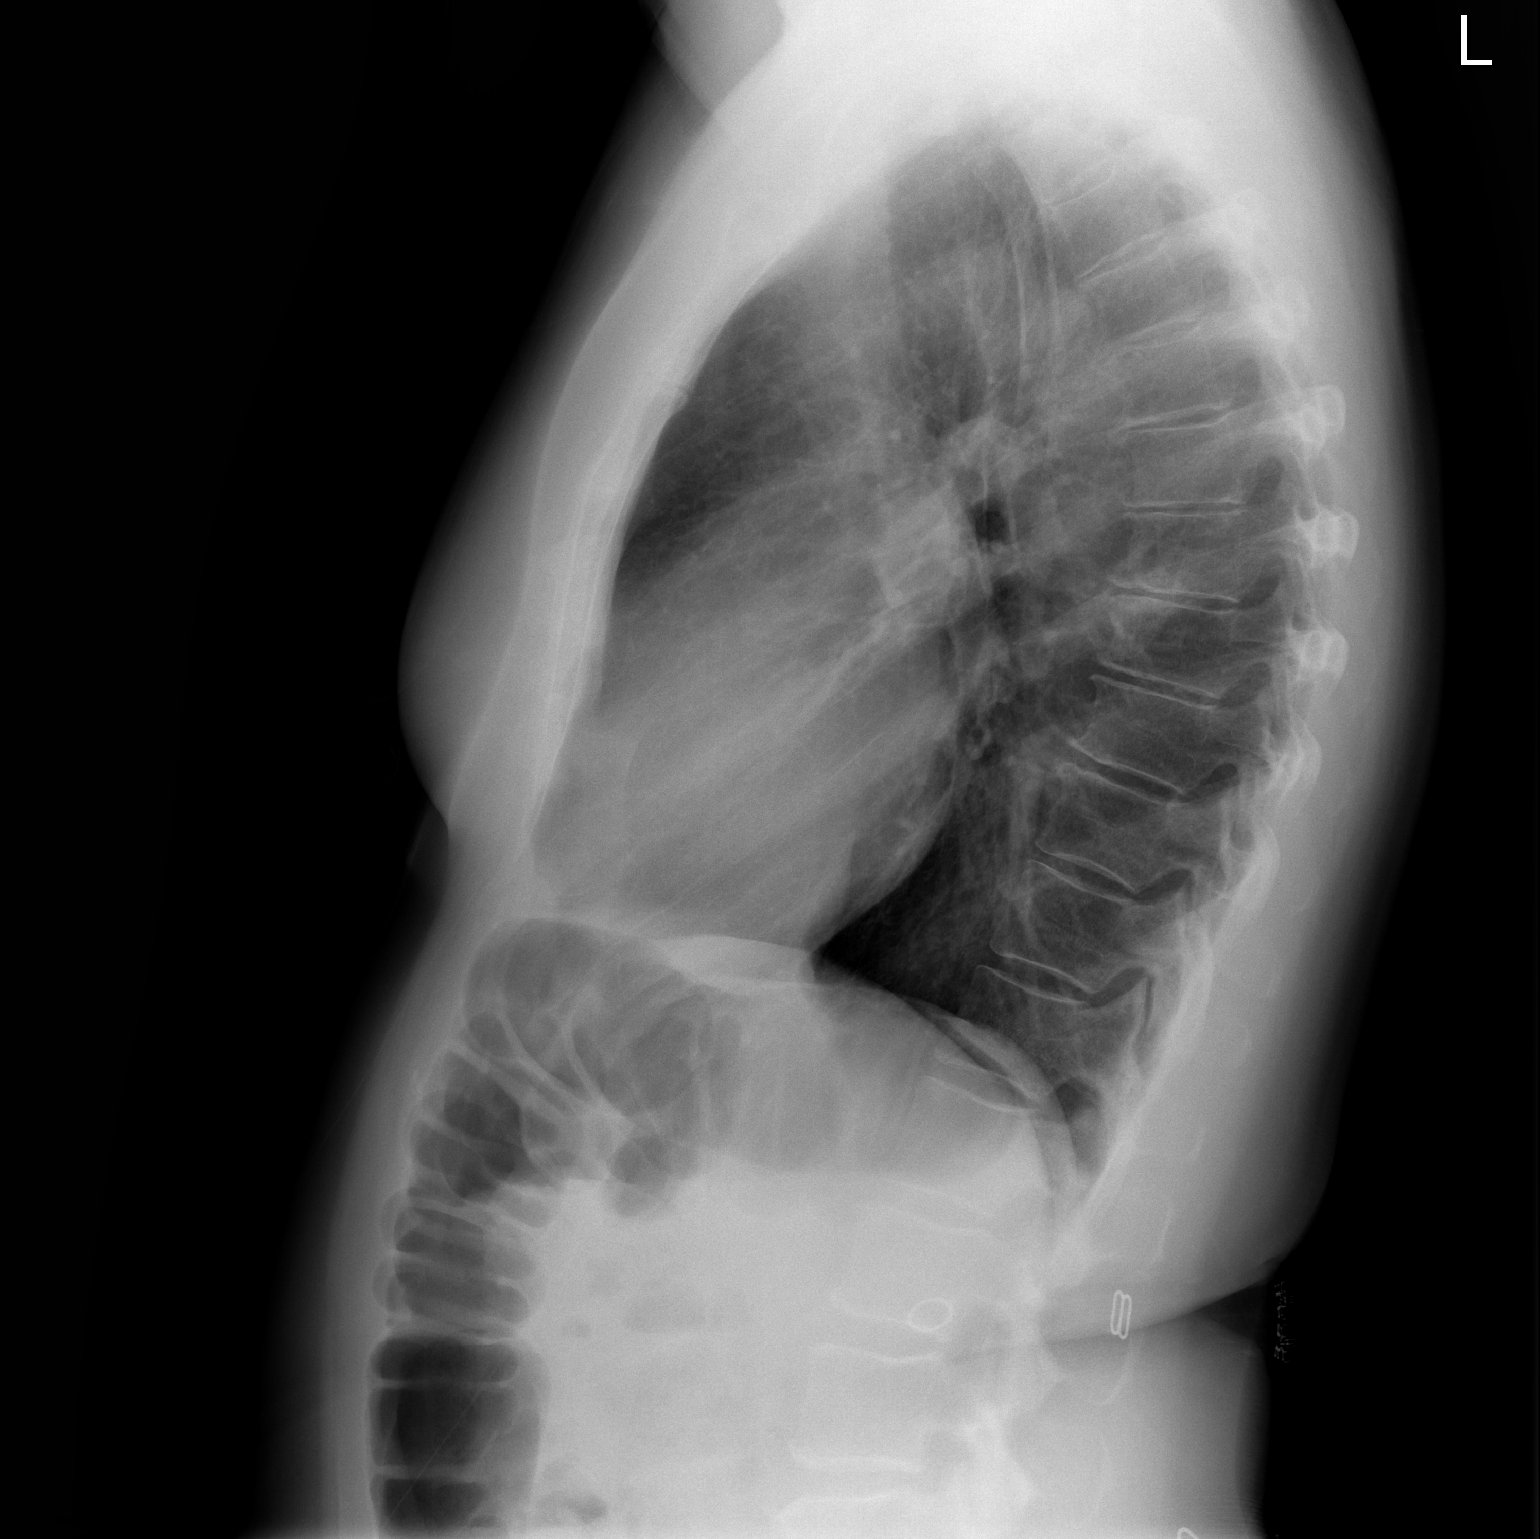

[2 of 2 positions shown; findings below may reference images not displayed]

FINDINGS: The heart size and mediastinal contours are within normal limits.
Both lungs are clear. The visualized skeletal structures are
unremarkable.
IMPRESSION: No active cardiopulmonary disease.

## 2021-07-09 ENCOUNTER — Other Ambulatory Visit: Payer: Self-pay | Admitting: Neurology

## 2021-07-09 NOTE — Telephone Encounter (Signed)
Last OV was on 05/26/21.  ?Next OV is scheduled for 11/25/21 .  ?Last RX was written on 04/11/21 for 270 tabs.  ? ?[facility] Database has been reviewed.  ?

## 2021-07-18 ENCOUNTER — Other Ambulatory Visit: Payer: Self-pay | Admitting: *Deleted

## 2021-07-18 NOTE — Patient Outreach (Signed)
[facility]) Care Management ? ?07/18/2021 ? ?Kristin Ramsey ?29-Mar-1961 ?[id number] ? ? ?RN case closure reason: MD office has RN CM that will follow patient. ?. ? ?Plan: ?RN sent patient case closure letter. ? ?Johny Shock BSN RN ?[facility] ?[phone] ? ?

## 2021-07-21 ENCOUNTER — Ambulatory Visit: Payer: Self-pay | Admitting: *Deleted

## 2021-09-25 ENCOUNTER — Encounter: Payer: Self-pay | Admitting: Neurology

## 2021-10-08 ENCOUNTER — Other Ambulatory Visit: Payer: Self-pay | Admitting: Neurology

## 2021-10-09 ENCOUNTER — Other Ambulatory Visit: Payer: Self-pay | Admitting: Neurology

## 2021-10-09 MED ORDER — PREGABALIN 100 MG PO CAPS: 100.0000 mg | ORAL_CAPSULE | Freq: Three times a day (TID) | ORAL | 1 refills | Status: DC

## 2021-10-09 NOTE — Telephone Encounter (Signed)
Pt is aware the request for the refill of her pregabalin (LYRICA) 100 MG capsule has been received.  Pt states she only has 2, so just enough for today.  Pt asked to be called if there is a chance this won't be filled

## 2021-10-16 ENCOUNTER — Other Ambulatory Visit: Payer: Self-pay | Admitting: Gastroenterology

## 2021-10-16 DIAGNOSIS — K5909 Other constipation: Secondary | ICD-10-CM

## 2021-10-22 ENCOUNTER — Other Ambulatory Visit: Payer: Self-pay | Admitting: Gastroenterology

## 2021-10-22 DIAGNOSIS — K5909 Other constipation: Secondary | ICD-10-CM

## 2021-11-25 ENCOUNTER — Ambulatory Visit: Payer: 59 | Admitting: Neurology

## 2021-12-02 ENCOUNTER — Ambulatory Visit: Payer: No Typology Code available for payment source | Admitting: Neurology

## 2022-01-07 ENCOUNTER — Ambulatory Visit: Payer: No Typology Code available for payment source | Admitting: Neurology

## 2022-01-07 ENCOUNTER — Encounter: Payer: Self-pay | Admitting: Neurology

## 2022-01-07 VITALS — BP 144/85 | HR 54 | Ht 67.0 in | Wt 167.0 lb

## 2022-01-07 DIAGNOSIS — G43709 Chronic migraine without aura, not intractable, without status migrainosus: Secondary | ICD-10-CM | POA: Diagnosis not present

## 2022-01-07 DIAGNOSIS — B0229 Other postherpetic nervous system involvement: Secondary | ICD-10-CM

## 2022-01-07 MED ORDER — OXTELLAR XR 300 MG PO TB24: ORAL_TABLET | ORAL | 4 refills | Status: DC

## 2022-01-07 NOTE — Patient Instructions (Signed)
It was great to meet you! I am glad you are doing well :) We will continue current medications Make sure your PCP checks labs These call if you need anything! See you in 1 year

## 2022-01-07 NOTE — Progress Notes (Signed)
Patient: Kristin Ramsey Date of Birth: [birth year]  Reason for Visit: Follow up History from: Patient Primary Neurologist: Krista Blue   ASSESSMENT AND PLAN 61 y.o. year old female   1.  Shingles involving right V1, mild involvement of right V2 in December 2018 2.  Prolonged postherpetic neuralgia 3.  Chronic migraine headaches -Stable doing well, doesn't want to adjust any medication  -Continue Lyrica 100 mg 3 times daily -Continue Oxtellar-XR 300 mg at bedtime -PCP is checking labs this week  -Migraines under excellent control, has not tried Maxalt but has if needed -Follow up in 1 year or sooner if needed   HISTORY  Kristin Ramsey is a 61 year old female, accompanied by her husband, seen in refer by  Cari Caraway, for evaluation of postherpetic neuralgia, initial evaluation was on May 10, 2017.   I reviewed and summarized the referring note, she had a past medical history of major depression, hiatal hernia, she did reported a history of chickenpox in the past,   She has history of chronic migraine, on April 09, 2017, she developed right forehead retro-orbital area headaches, she thought it was her usual migraine headaches, but she had persistent severe pain, went to the emergency room on April 10, 2017, cocktail treatment did not help her headache.   On April 11, 2017, she noticed blisters breaking out at the right forehead region, also noticed right eye light sensitivity, tearing, right upper eyelid swollen, presented to the emergency room on April 13, 2017, was diagnosis with herpes, was given valacyclovir 1 g 3 times a day for 2 weeks,   She was seen by Dr. Carolynn Sayers on April 16, 2017, later in January 2019, she was found to have right corneal involvement, received eyedrops ofloxacin 0.3%, Keflex 500 mg twice a day for 10 days,   Now she has persistent right eye sensitivity, tearing, was not able to go back to work, also right forehead region itching, burning,  hypersensitivity,   She only took prescribed gabapentin 300 mg twice, does help her sleep, but complains of drowsiness, she also complains of worsening depression anxiety.   UPDATE Jun 10 2017: She has never tried Trileptal 150 mg twice a day as prescribed, worried about the side effect, overall her symptoms has improved, she is no longer has constant right eye tearing, but still has right eye foreign object sensation, no loss of vision, was seen by her ophthalmologist recently, was told she is healing well, she continue has mild right upper eyelid swelling, right forehead skin discoloration, swelling, she is taking gabapentin 300 mg up to 4-5 tablets each day, right forehead region neuropathic pain, complains of fatigue, drowsiness, on short-term disability.  Also taking clonazepam 0.5 mg every night to help the itching and sleep,   UPDATE Sep 04 2019: She is not taking gabapentin 300 mg 6 tablets a day, Oxtellar 300 mg every night, continue complains of significant sensitivity at the right V1 distribution, exacerbated by touching, washing her hair, excessive sun exposure, right eye sensitivity has much improved, there was no significant right visual loss, she is continue follow-up by her ophthalmologist   Laboratory evaluation in July 2020 showed normal BMP, sodium of 138, CBC, hemoglobin of 12.8.   UPDATE Mar 26 2020: She is very happy with current combination, Lyrica 100 mg 3 times a day, she has decreased Oxtellar to 300 mg at bedtime, she tolerated the medication well, no apparent side effect, occasional weakness in the right V1 territory   UPDATE Feb  6th 2023: She continues to have right V2, V1 area skin sensitivity, continue with Oxtellar 300 mg every night, it does help her sleep, she could not tolerate sleeping on her right side, complains of right side skin sensitivity,   She had long history of migraine headaches, right lateralized severe pounding headache with light noise sensitivity,  nauseous, lasting couple hours or longer, has not had recurrent for a while  She went through very stressful 2022, she lost her mother, just moved her father to assisted living, since January 2023, she began to have intermittent migraine headache, had 2 major prolonged migraine, Imitrex 100 mg as needed provided help most of the time, 34, [place] so even longer for [place] to take effective, sleep always helpful, she also complains tension in her right neck,  Update January 07, 2022 SS: Only significant migraine is with weather change, once a month, takes few ibuprofen and it improves. Usually right sided. She is pleased. Facial pain is doing great, was able to go to the beach for 1 week, has been unbearable in the past with heat and sun. Still has some sensitivity, still sensitive when fixing hair to dry or curl. Still taking Lyrica 100 mg 3 times daily, Oxtellar XR 300 mg daily. Takes tizanidine PRN. Has not needed the Imitrex, has never taken the Maxalt. Claims energy is more, no significant snoring. PCP checks labs.  Stress level is better.  REVIEW OF SYSTEMS: Out of a complete 14 system review of symptoms, the patient complains only of the following symptoms, and all other reviewed systems are negative.  See HPI  ALLERGIES: Allergies  Allergen Reactions   Erythromycin Nausea And Vomiting and Other (See Comments)    Abdominal pain.   Latex Itching   Nitrous Oxide Nausea And Vomiting   Other Nausea Only    Anesthesia   Sulfamethoxazole-Trimethoprim Other (See Comments)    Fatigue/depression (lack of physical and emotional energy)    HOME MEDICATIONS: Outpatient Medications Prior to Visit  Medication Sig Dispense Refill   escitalopram (LEXAPRO) 10 MG tablet Take 10 mg by mouth every evening.      estradiol (VIVELLE-DOT) 0.05 MG/24HR patch Place 1 patch onto the skin 2 (two) times a week.     fluticasone (FLONASE) 50 MCG/ACT nasal spray Place 1 spray into both nostrils daily.      hydrochlorothiazide (HYDRODIURIL) 12.5 MG tablet Take 12.5 mg by mouth daily.   2   lactase (LACTAID) 3000 units tablet Take 3,000 Units by mouth 3 (three) times daily as needed (prior to dairy consumption).     LUMIFY 0.025 % SOLN Place 1 drop into both eyes daily.     metoprolol succinate (TOPROL-XL) 50 MG 24 hr tablet Take 50 mg by mouth daily.   0   ondansetron (ZOFRAN) 4 MG tablet Take 1 tablet (4 mg total) by mouth every 8 (eight) hours as needed for nausea or vomiting. 20 tablet 6   pregabalin (LYRICA) 100 MG capsule Take 1 capsule (100 mg total) by mouth 3 (three) times daily. 270 capsule 1   rizatriptan (MAXALT-MLT) 10 MG disintegrating tablet Take 1 tab at onset of migraine.  May repeat in 2 hrs, if needed.  Max dose: 2 tabs/day. This is a 30 day prescription. 10 tablet 6   OXTELLAR XR 300 MG TB24 TAKE 1 TABLET BY MOUTH EVERYDAY AT BEDTIME 90 tablet 4   SUMAtriptan (IMITREX) 100 MG tablet Take 100 mg by mouth as needed. (Patient not taking: Reported on 01/07/2022)  tiZANidine (ZANAFLEX) 4 MG tablet Take 1 tablet (4 mg total) by mouth every 6 (six) hours as needed for muscle spasms. (Patient not taking: Reported on 01/07/2022) 30 tablet 6   LINZESS 72 MCG capsule TAKE 1 CAPSULE BY MOUTH EVERY DAY BEFORE BREAKFAST 30 capsule 2   No facility-administered medications prior to visit.    PAST MEDICAL HISTORY: Past Medical History:  Diagnosis Date   Anxiety    Arthritis    GERD (gastroesophageal reflux disease)    Hypertension    Interstitial cystitis    Shingles     PAST SURGICAL HISTORY: Past Surgical History:  Procedure Laterality Date   ABDOMINAL HYSTERECTOMY     COLONOSCOPY  2009   normal   HYSTEROSCOPY     LEFT HEART CATH AND CORONARY ANGIOGRAPHY N/A 11/25/2018   Procedure: LEFT HEART CATH AND CORONARY ANGIOGRAPHY;  Surgeon: Nelva Bush, MD;  Location: [facility];  Service: Cardiovascular;  Laterality: N/A;   TONSILLECTOMY     UPPER GASTROINTESTINAL ENDOSCOPY   2009   hiatal hernia    FAMILY HISTORY: Family History  Problem Relation Age of Onset   Stomach cancer Mother    Crohn's disease Mother    Brain cancer Mother    Hypertension Father    Anxiety disorder Father    Macular degeneration Father    Prostate cancer Father    Colon polyps Father    Colon cancer Neg Hx    Esophageal cancer Neg Hx    Rectal cancer Neg Hx     SOCIAL HISTORY: Social History   Socioeconomic History   Marital status: Married    Spouse name: Not on file   Number of children: 1   Years of education: 16   Highest education level: Bachelor's degree (e.g., BA, AB, BS)  Occupational History   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   Right-handed.   0.5 cup of coffee per day, occasional soda.   Social Determinants of Health   Financial Resource Strain: Not on file  Food Insecurity: No Food Insecurity (07/22/2020)   Hunger Vital Sign    Worried About Running Out of Food in the Last Year: Never true    Ran Out of Food in the Last Year: Never true  Transportation Needs: No Transportation Needs (07/22/2020)   PRAPARE - Hydrologist (Medical): No    Lack of Transportation (Non-Medical): No  Physical Activity: Not on file  Stress: Not on file  Social Connections: Not on file  Intimate Partner Violence: Not on file    PHYSICAL EXAM  Vitals:   01/07/22 1434  BP: (!) 144/85  Pulse: (!) 54  Weight: 167 lb (75.8 kg)  Height: '5\' 7"'$  (1.702 m)   Body mass index is 26.16 kg/m.  Generalized: Well developed, in no acute distress  Neurological examination   Mentation: Alert oriented to time, place, history taking. Follows all commands speech and language fluent Cranial nerve II-XII: Pupils were equal round reactive to light. Extraocular  movements were full, visual field were full on confrontational test. Facial sensation and strength were normal. Head turning and shoulder shrug  were normal and symmetric. Motor: The motor testing reveals 5 over 5 strength of all 4 extremities. Good symmetric motor tone is noted throughout.  Sensory: Sensory testing is intact to soft touch on all 4 extremities. No evidence of extinction is noted.  Coordination: Cerebellar testing reveals good finger-nose-finger and heel-to-shin bilaterally.  Gait and station: Gait is normal.  Reflexes: Deep tendon reflexes are symmetric and normal bilaterally.   DIAGNOSTIC DATA (LABS, IMAGING, TESTING) - I reviewed patient records, labs, notes, testing and imaging myself where available.  Lab Results  Component Value Date   WBC 6.0 09/04/2019   HGB 13.5 09/04/2019   HCT 40.5 09/04/2019   MCV 92 09/04/2019   PLT 210 11/02/2018      Component Value Date/Time   NA 139 09/04/2019 1348   K 5.2 09/04/2019 1348   CL 101 09/04/2019 1348   CO2 26 09/04/2019 1348   GLUCOSE 84 09/04/2019 1348   GLUCOSE 87 05/21/2009 1224   BUN 17 09/04/2019 1348   CREATININE 0.72 09/04/2019 1348   CALCIUM 9.3 09/04/2019 1348   PROT 6.8 09/04/2019 1348   ALBUMIN 4.3 09/04/2019 1348   AST 16 09/04/2019 1348   ALT 9 09/04/2019 1348   ALKPHOS 66 09/04/2019 1348   BILITOT 0.2 09/04/2019 1348   GFRNONAA 93 09/04/2019 1348   GFRAA 108 09/04/2019 1348   No results found for: "CHOL", "HDL", "LDLCALC", "LDLDIRECT", "TRIG", "CHOLHDL" No results found for: "HGBA1C" Lab Results  Component Value Date   VITAMINB12 290 05/21/2009   Lab Results  Component Value Date   TSH 1.490 09/04/2019    Butler Denmark, AGNP-C, DNP 01/07/2022, 3:14 PM [facility] [address], [place], [place] [address] [phone]

## 2022-01-09 DIAGNOSIS — I10 Essential (primary) hypertension: Secondary | ICD-10-CM | POA: Diagnosis not present

## 2022-01-09 DIAGNOSIS — E785 Hyperlipidemia, unspecified: Secondary | ICD-10-CM | POA: Diagnosis not present

## 2022-01-09 DIAGNOSIS — E559 Vitamin D deficiency, unspecified: Secondary | ICD-10-CM | POA: Diagnosis not present

## 2022-01-09 DIAGNOSIS — R69 Illness, unspecified: Secondary | ICD-10-CM | POA: Diagnosis not present

## 2022-04-10 ENCOUNTER — Other Ambulatory Visit: Payer: Self-pay | Admitting: Neurology

## 2022-04-10 ENCOUNTER — Telehealth: Payer: Self-pay | Admitting: Neurology

## 2022-04-10 NOTE — Telephone Encounter (Signed)
Pt is calling. [facility] sent over a request for medication  Pregabalin 100 MG. Pt wants to know when prescription will be sent to pharmacy because she only has two days of medication

## 2022-04-10 NOTE — Telephone Encounter (Signed)
Pt called again wanting to know when her Rx will be sent in to the pharmacy. Please advise.

## 2022-04-11 ENCOUNTER — Telehealth: Payer: Self-pay | Admitting: Neurology

## 2022-04-11 ENCOUNTER — Other Ambulatory Visit: Payer: Self-pay | Admitting: Neurology

## 2022-04-11 NOTE — Telephone Encounter (Signed)
I received a message from [facility] answering service about this patient`s refill request for lyrica not being completed and she will run out. I saw a rx by Dr Krista Blue which was done but not signed yet so I completed the request

## 2022-04-14 NOTE — Telephone Encounter (Signed)
Dr. Leonie Man has sent out patient's medication.

## 2022-05-06 DIAGNOSIS — Z1231 Encounter for screening mammogram for malignant neoplasm of breast: Secondary | ICD-10-CM | POA: Diagnosis not present

## 2022-05-06 DIAGNOSIS — Z01419 Encounter for gynecological examination (general) (routine) without abnormal findings: Secondary | ICD-10-CM | POA: Diagnosis not present

## 2022-05-06 DIAGNOSIS — Z1272 Encounter for screening for malignant neoplasm of vagina: Secondary | ICD-10-CM | POA: Diagnosis not present

## 2022-05-06 DIAGNOSIS — Z6826 Body mass index (BMI) 26.0-26.9, adult: Secondary | ICD-10-CM | POA: Diagnosis not present

## 2022-05-06 DIAGNOSIS — Z13820 Encounter for screening for osteoporosis: Secondary | ICD-10-CM | POA: Diagnosis not present

## 2022-05-06 DIAGNOSIS — R69 Illness, unspecified: Secondary | ICD-10-CM | POA: Diagnosis not present

## 2022-07-08 DIAGNOSIS — E559 Vitamin D deficiency, unspecified: Secondary | ICD-10-CM | POA: Diagnosis not present

## 2022-07-08 DIAGNOSIS — Z23 Encounter for immunization: Secondary | ICD-10-CM | POA: Diagnosis not present

## 2022-07-08 DIAGNOSIS — E785 Hyperlipidemia, unspecified: Secondary | ICD-10-CM | POA: Diagnosis not present

## 2022-07-08 DIAGNOSIS — Z6827 Body mass index (BMI) 27.0-27.9, adult: Secondary | ICD-10-CM | POA: Diagnosis not present

## 2022-07-08 DIAGNOSIS — I10 Essential (primary) hypertension: Secondary | ICD-10-CM | POA: Diagnosis not present

## 2022-07-10 ENCOUNTER — Telehealth: Payer: Self-pay | Admitting: Neurology

## 2022-07-10 ENCOUNTER — Other Ambulatory Visit: Payer: Self-pay | Admitting: Neurology

## 2022-07-10 NOTE — Telephone Encounter (Signed)
Pt called. Requesting a refill on Pregabalin 100 MG. [facility] to  [id number] -  Pt is requesting a three month supply

## 2022-07-12 ENCOUNTER — Other Ambulatory Visit: Payer: Self-pay | Admitting: Neurology

## 2022-07-12 MED ORDER — PREGABALIN 100 MG PO CAPS: ORAL_CAPSULE | ORAL | 0 refills | Status: DC

## 2022-07-12 NOTE — Telephone Encounter (Signed)
Patient paged on call Sunday I refilled for onemonth please send in a refill for however many more months per your clinical judgement

## 2022-07-13 ENCOUNTER — Other Ambulatory Visit: Payer: Self-pay | Admitting: Neurology

## 2022-07-13 MED ORDER — PREGABALIN 100 MG PO CAPS: ORAL_CAPSULE | ORAL | 1 refills | Status: DC

## 2022-07-13 NOTE — Telephone Encounter (Signed)
LVM

## 2022-07-13 NOTE — Telephone Encounter (Signed)
I cancelled the Dr. Jaynee Eagles 30 day script for Lyrica and send in 3 month supply with 1 refill.

## 2022-07-13 NOTE — Progress Notes (Signed)
Meds ordered this encounter  Medications   pregabalin (LYRICA) 100 MG capsule    Sig: TAKE 1 CAPSULE (100 MG TOTAL) BY MOUTH THREE TIMES DAILY.    Dispense:  270 capsule    Refill:  1    Please cancel 30 day script. Refill this 90 day script. Thanks

## 2022-10-05 DIAGNOSIS — Z6826 Body mass index (BMI) 26.0-26.9, adult: Secondary | ICD-10-CM | POA: Diagnosis not present

## 2022-10-05 DIAGNOSIS — I10 Essential (primary) hypertension: Secondary | ICD-10-CM | POA: Diagnosis not present

## 2022-10-05 DIAGNOSIS — R001 Bradycardia, unspecified: Secondary | ICD-10-CM | POA: Diagnosis not present

## 2022-10-20 DIAGNOSIS — Z6826 Body mass index (BMI) 26.0-26.9, adult: Secondary | ICD-10-CM | POA: Diagnosis not present

## 2022-10-20 DIAGNOSIS — I10 Essential (primary) hypertension: Secondary | ICD-10-CM | POA: Diagnosis not present

## 2022-10-20 DIAGNOSIS — F411 Generalized anxiety disorder: Secondary | ICD-10-CM | POA: Diagnosis not present

## 2022-10-29 DIAGNOSIS — R1030 Lower abdominal pain, unspecified: Secondary | ICD-10-CM | POA: Diagnosis not present

## 2022-10-29 DIAGNOSIS — R102 Pelvic and perineal pain: Secondary | ICD-10-CM | POA: Diagnosis not present

## 2022-11-16 DIAGNOSIS — R102 Pelvic and perineal pain: Secondary | ICD-10-CM | POA: Diagnosis not present

## 2023-01-05 DIAGNOSIS — I10 Essential (primary) hypertension: Secondary | ICD-10-CM | POA: Diagnosis not present

## 2023-01-05 DIAGNOSIS — F3341 Major depressive disorder, recurrent, in partial remission: Secondary | ICD-10-CM | POA: Diagnosis not present

## 2023-01-05 DIAGNOSIS — F411 Generalized anxiety disorder: Secondary | ICD-10-CM | POA: Diagnosis not present

## 2023-01-05 DIAGNOSIS — Z23 Encounter for immunization: Secondary | ICD-10-CM | POA: Diagnosis not present

## 2023-01-05 DIAGNOSIS — E785 Hyperlipidemia, unspecified: Secondary | ICD-10-CM | POA: Diagnosis not present

## 2023-01-05 DIAGNOSIS — N951 Menopausal and female climacteric states: Secondary | ICD-10-CM | POA: Diagnosis not present

## 2023-01-05 DIAGNOSIS — Z6825 Body mass index (BMI) 25.0-25.9, adult: Secondary | ICD-10-CM | POA: Diagnosis not present

## 2023-01-05 DIAGNOSIS — E559 Vitamin D deficiency, unspecified: Secondary | ICD-10-CM | POA: Diagnosis not present

## 2023-01-05 DIAGNOSIS — K219 Gastro-esophageal reflux disease without esophagitis: Secondary | ICD-10-CM | POA: Diagnosis not present

## 2023-01-05 DIAGNOSIS — G43009 Migraine without aura, not intractable, without status migrainosus: Secondary | ICD-10-CM | POA: Diagnosis not present

## 2023-01-05 DIAGNOSIS — B0229 Other postherpetic nervous system involvement: Secondary | ICD-10-CM | POA: Diagnosis not present

## 2023-01-12 NOTE — Progress Notes (Unsigned)
Patient: Kristin Ramsey Date of Birth: 09-14-1960  Reason for Visit: Follow up History from: Patient Primary Neurologist: Terrace Arabia   ASSESSMENT AND PLAN 62 y.o. year old female   1.  Shingles involving right V1, mild involvement of right V2 in December 2018 2.  Prolonged postherpetic neuralgia 3.  Chronic migraine headaches -Stable doing well, may try to reduce Lyrica down to 100 mg BID -Continue Lyrica 100 mg up to 3 times daily -Continue Oxtellar-XR 300 mg at bedtime -I reviewed labs from PCP -Follow up in 1 year or sooner if needed   Meds ordered this encounter  Medications   OXcarbazepine ER (OXTELLAR XR) 300 MG TB24    Sig: TAKE 1 TABLET BY MOUTH EVERYDAY AT BEDTIME    Dispense:  90 tablet    Refill:  4   pregabalin (LYRICA) 100 MG capsule    Sig: TAKE 1 CAPSULE (100 MG TOTAL) BY MOUTH THREE TIMES DAILY.    Dispense:  90 capsule    Refill:  5   HISTORY  Kristin Ramsey is a 62 year old female, accompanied by her husband, seen in refer by  Gweneth Dimitri, for evaluation of postherpetic neuralgia, initial evaluation was on May 10, 2017.   I reviewed and summarized the referring note, she had a past medical history of major depression, hiatal hernia, she did reported a history of chickenpox in the past,   She has history of chronic migraine, on April 09, 2017, she developed right forehead retro-orbital area headaches, she thought it was her usual migraine headaches, but she had persistent severe pain, went to the emergency room on April 10, 2017, cocktail treatment did not help her headache.   On April 11, 2017, she noticed blisters breaking out at the right forehead region, also noticed right eye light sensitivity, tearing, right upper eyelid swollen, presented to the emergency room on April 13, 2017, was diagnosis with herpes, was given valacyclovir 1 g 3 times a day for 2 weeks,   She was seen by Dr. Alden Hipp on April 16, 2017, later in January 2019, she  was found to have right corneal involvement, received eyedrops ofloxacin 0.3%, Keflex 500 mg twice a day for 10 days,   Now she has persistent right eye sensitivity, tearing, was not able to go back to work, also right forehead region itching, burning, hypersensitivity,   She only took prescribed gabapentin 300 mg twice, does help her sleep, but complains of drowsiness, she also complains of worsening depression anxiety.   UPDATE Jun 10 2017: She has never tried Trileptal 150 mg twice a day as prescribed, worried about the side effect, overall her symptoms has improved, she is no longer has constant right eye tearing, but still has right eye foreign object sensation, no loss of vision, was seen by her ophthalmologist recently, was told she is healing well, she continue has mild right upper eyelid swelling, right forehead skin discoloration, swelling, she is taking gabapentin 300 mg up to 4-5 tablets each day, right forehead region neuropathic pain, complains of fatigue, drowsiness, on short-term disability.  Also taking clonazepam 0.5 mg every night to help the itching and sleep,   UPDATE Sep 04 2019: She is not taking gabapentin 300 mg 6 tablets a day, Oxtellar 300 mg every night, continue complains of significant sensitivity at the right V1 distribution, exacerbated by touching, washing her hair, excessive sun exposure, right eye sensitivity has much improved, there was no significant right visual loss, she is continue follow-up by  her ophthalmologist   Laboratory evaluation in July 2020 showed normal BMP, sodium of 138, CBC, hemoglobin of 12.8.   UPDATE Mar 26 2020: She is very happy with current combination, Lyrica 100 mg 3 times a day, she has decreased Oxtellar to 300 mg at bedtime, she tolerated the medication well, no apparent side effect, occasional weakness in the right V1 territory   UPDATE Feb 6th 2023: She continues to have right V2, V1 area skin sensitivity, continue with Oxtellar 300  mg every night, it does help her sleep, she could not tolerate sleeping on her right side, complains of right side skin sensitivity,   She had long history of migraine headaches, right lateralized severe pounding headache with light noise sensitivity, nauseous, lasting couple hours or longer, has not had recurrent for a while  She went through very stressful 2022, she lost her mother, just moved her father to assisted living, since January 2023, she began to have intermittent migraine headache, had 2 major prolonged migraine, Imitrex 100 mg as needed provided help most of the time, 32,  so even longer for [place] to take effective, sleep always helpful, she also complains tension in her right neck,  Update January 07, 2022 SS: Only significant migraine is with weather change, once a month, takes few ibuprofen and it improves. Usually right sided. She is pleased. Facial pain is doing great, was able to go to the beach for 1 week, has been unbearable in the past with heat and sun. Still has some sensitivity, still sensitive when fixing hair to dry or curl. Still taking Lyrica 100 mg 3 times daily, Oxtellar XR 300 mg daily. Takes tizanidine PRN. Has not needed the Imitrex, has never taken the Maxalt. Claims energy is more, no significant snoring. PCP checks labs.  Stress level is better.  Update January 13, 2023 SS: Labs from PCP 01/05/2023, creatinine 0.82, sodium 139, AST 15, ALT 12. Doing much better, if weather change will feel tightness to right V1, when time to wash hair, area feels heavy. Remains on Lyrica 100 mg TID, Oxtellar XR 300 mg at bedtime. Has not had any migraines, only a few sinus related headaches. Walks nightly. Her daughter is now back at home.   REVIEW OF SYSTEMS: Out of a complete 14 system review of symptoms, the patient complains only of the following symptoms, and all other reviewed systems are negative.  See HPI  ALLERGIES: Allergies  Allergen Reactions   Erythromycin  Nausea And Vomiting and Other (See Comments)    Abdominal pain.   Latex Itching   Nitrous Oxide Nausea And Vomiting   Other Nausea Only    Anesthesia   Sulfamethoxazole-Trimethoprim Other (See Comments)    Fatigue/depression (lack of physical and emotional energy)    HOME MEDICATIONS: Outpatient Medications Prior to Visit  Medication Sig Dispense Refill   escitalopram (LEXAPRO) 10 MG tablet Take 10 mg by mouth every evening.      estradiol (VIVELLE-DOT) 0.05 MG/24HR patch Place 1 patch onto the skin 2 (two) times a week.     fluticasone (FLONASE) 50 MCG/ACT nasal spray Place 1 spray into both nostrils daily.     hydrochlorothiazide (HYDRODIURIL) 12.5 MG tablet Take 12.5 mg by mouth daily.   2   lactase (LACTAID) 3000 units tablet Take 3,000 Units by mouth 3 (three) times daily as needed (prior to dairy consumption).     LUMIFY 0.025 % SOLN Place 1 drop into both eyes daily.     metoprolol succinate (TOPROL-XL)  50 MG 24 hr tablet Take 50 mg by mouth daily.   0   ondansetron (ZOFRAN) 4 MG tablet Take 1 tablet (4 mg total) by mouth every 8 (eight) hours as needed for nausea or vomiting. 20 tablet 6   OXcarbazepine ER (OXTELLAR XR) 300 MG TB24 TAKE 1 TABLET BY MOUTH EVERYDAY AT BEDTIME 90 tablet 4   pregabalin (LYRICA) 100 MG capsule TAKE 1 CAPSULE (100 MG TOTAL) BY MOUTH THREE TIMES DAILY. 270 capsule 1   SUMAtriptan (IMITREX) 100 MG tablet Take 100 mg by mouth as needed.     tiZANidine (ZANAFLEX) 4 MG tablet Take 1 tablet (4 mg total) by mouth every 6 (six) hours as needed for muscle spasms. 30 tablet 6   rizatriptan (MAXALT-MLT) 10 MG disintegrating tablet Take 1 tab at onset of migraine.  May repeat in 2 hrs, if needed.  Max dose: 2 tabs/day. This is a 30 day prescription. 10 tablet 6   No facility-administered medications prior to visit.    PAST MEDICAL HISTORY: Past Medical History:  Diagnosis Date   Anxiety    Arthritis    GERD (gastroesophageal reflux disease)    Hypertension     Interstitial cystitis    Shingles     PAST SURGICAL HISTORY: Past Surgical History:  Procedure Laterality Date   ABDOMINAL HYSTERECTOMY     COLONOSCOPY  2009   normal   HYSTEROSCOPY     LEFT HEART CATH AND CORONARY ANGIOGRAPHY N/A 11/25/2018   Procedure: LEFT HEART CATH AND CORONARY ANGIOGRAPHY;  Surgeon: Yvonne Kendall, MD;  Location: [facility];  Service: Cardiovascular;  Laterality: N/A;   TONSILLECTOMY     UPPER GASTROINTESTINAL ENDOSCOPY  2009   hiatal hernia    FAMILY HISTORY: Family History  Problem Relation Age of Onset   Stomach cancer Mother    Crohn's disease Mother    Brain cancer Mother    Hypertension Father    Anxiety disorder Father    Macular degeneration Father    Prostate cancer Father    Colon polyps Father    Colon cancer Neg Hx    Esophageal cancer Neg Hx    Rectal cancer Neg Hx     SOCIAL HISTORY: Social History   Socioeconomic History   Marital status: Married    Spouse name: Not on file   Number of children: 1   Years of education: 16   Highest education level: Bachelor's degree (e.g., BA, AB, BS)  Occupational History   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   Right-handed.   0.5 cup of coffee per day, occasional soda.   Social Determinants of Health   Financial Resource Strain: Not on file  Food Insecurity: No Food Insecurity (07/22/2020)   Hunger Vital Sign    Worried About Running Out of Food in the Last Year: Never true    Ran Out of Food in the Last Year: Never true  Transportation Needs: No Transportation Needs (07/22/2020)   PRAPARE -  Transportation  Lack of Transportation (Medical): No    Lack of Transportation (Non-Medical): No  Physical Activity: Not on file  Stress: Not on file  Social Connections: Not on file  Intimate Partner Violence: Not on file    PHYSICAL EXAM  Vitals:   01/13/23 0851  BP: 126/81  Pulse: 66  Weight: 167 lb 1.7 oz (75.8 kg)  Height: 5\' 7"  (1.702 m)    Body mass index is 26.17 kg/m.  Generalized: Well developed, in no acute distress  Neurological examination  Mentation: Alert oriented to time, place, history taking. Follows all commands speech and language fluent Cranial nerve II-XII: Pupils were equal round reactive to light. Extraocular movements were full, visual field were full on confrontational test. Facial sensation and strength were normal. Head turning and shoulder shrug  were normal and symmetric. Motor: The motor testing reveals 5 over 5 strength of all 4 extremities. Good symmetric motor tone is noted throughout.  Sensory: Sensory testing is intact to soft touch on all 4 extremities. No evidence of extinction is noted.  Coordination: Cerebellar testing reveals good finger-nose-finger and heel-to-shin bilaterally.  Gait and station: Gait is normal.  Tandem gait is normal. Reflexes: Deep tendon reflexes are symmetric and normal bilaterally.   DIAGNOSTIC DATA (LABS, IMAGING, TESTING) - I reviewed patient records, labs, notes, testing and imaging myself where available.  Lab Results  Component Value Date   WBC 6.0 09/04/2019   HGB 13.5 09/04/2019   HCT 40.5 09/04/2019   MCV 92 09/04/2019   PLT 210 11/02/2018      Component Value Date/Time   NA 139 09/04/2019 1348   K 5.2 09/04/2019 1348   CL 101 09/04/2019 1348   CO2 26 09/04/2019 1348   GLUCOSE 84 09/04/2019 1348   GLUCOSE 87 05/21/2009 1224   BUN 17 09/04/2019 1348   CREATININE 0.72 09/04/2019 1348   CALCIUM 9.3 09/04/2019 1348   PROT 6.8 09/04/2019 1348   ALBUMIN 4.3 09/04/2019 1348   AST 16 09/04/2019  1348   ALT 9 09/04/2019 1348   ALKPHOS 66 09/04/2019 1348   BILITOT 0.2 09/04/2019 1348   GFRNONAA 93 09/04/2019 1348   GFRAA 108 09/04/2019 1348   No results found for: "CHOL", "HDL", "LDLCALC", "LDLDIRECT", "TRIG", "CHOLHDL" No results found for: "HGBA1C" Lab Results  Component Value Date   VITAMINB12 290 05/21/2009   Lab Results  Component Value Date   TSH 1.490 09/04/2019    Margie Ege, AGNP-C, DNP 01/13/2023, 8:53 AM [facility] [address] [place], [place] [address] [phone]

## 2023-01-13 ENCOUNTER — Ambulatory Visit: Payer: No Typology Code available for payment source | Admitting: Neurology

## 2023-01-13 ENCOUNTER — Encounter: Payer: Self-pay | Admitting: Neurology

## 2023-01-13 VITALS — BP 126/81 | HR 66 | Ht 67.0 in | Wt 167.1 lb

## 2023-01-13 DIAGNOSIS — B0229 Other postherpetic nervous system involvement: Secondary | ICD-10-CM

## 2023-01-13 DIAGNOSIS — G43709 Chronic migraine without aura, not intractable, without status migrainosus: Secondary | ICD-10-CM

## 2023-01-13 MED ORDER — OXTELLAR XR 300 MG PO TB24: ORAL_TABLET | ORAL | 4 refills | Status: DC

## 2023-01-13 MED ORDER — PREGABALIN 100 MG PO CAPS: ORAL_CAPSULE | ORAL | 5 refills | Status: DC

## 2023-01-13 NOTE — Patient Instructions (Signed)
Try to reduce Lyrica down to 100 mg twice daily.  Will continue the Oxtellar.  I sent refill sent.  Please let me know if you need anything.  See you in 1 year.  Thanks!!  Meds ordered this encounter  Medications   OXcarbazepine ER (OXTELLAR XR) 300 MG TB24    Sig: TAKE 1 TABLET BY MOUTH EVERYDAY AT BEDTIME    Dispense:  90 tablet    Refill:  4   pregabalin (LYRICA) 100 MG capsule    Sig: TAKE 1 CAPSULE (100 MG TOTAL) BY MOUTH THREE TIMES DAILY.    Dispense:  90 capsule    Refill:  5

## 2023-03-25 ENCOUNTER — Encounter: Payer: Self-pay | Admitting: Neurology

## 2023-03-25 ENCOUNTER — Telehealth: Payer: Self-pay | Admitting: Neurology

## 2023-03-25 NOTE — Telephone Encounter (Signed)
LVM and sent letter in mail informing pt of need to reschedule 01/13/24 appt - NP out

## 2023-04-01 ENCOUNTER — Other Ambulatory Visit (HOSPITAL_BASED_OUTPATIENT_CLINIC_OR_DEPARTMENT_OTHER): Payer: Self-pay

## 2023-04-27 ENCOUNTER — Ambulatory Visit: Payer: BC Managed Care – PPO | Admitting: Gastroenterology

## 2023-04-27 VITALS — BP 122/76 | HR 58 | Ht 67.0 in | Wt 163.5 lb

## 2023-04-27 DIAGNOSIS — R1013 Epigastric pain: Secondary | ICD-10-CM | POA: Diagnosis not present

## 2023-04-27 DIAGNOSIS — R1319 Other dysphagia: Secondary | ICD-10-CM

## 2023-04-27 DIAGNOSIS — K5909 Other constipation: Secondary | ICD-10-CM | POA: Diagnosis not present

## 2023-04-27 DIAGNOSIS — R1310 Dysphagia, unspecified: Secondary | ICD-10-CM | POA: Diagnosis not present

## 2023-04-27 NOTE — Progress Notes (Signed)
 Kristin Ramsey Consult Note:  History: Kristin Ramsey 04/27/2023  Referring provider: Aisha Harvey, MD  Reason for consult/chief complaint: Abdominal Pain (She has been having epigastric pain from the middle to the left side.  She has been fine the last 2 weeks but she also stopped eating salads since then.) and Dysphagia (Patient says that she feels that food especially bread is getting stuck in her esophagus.  Seems to slowly go down.  )   Subjective  Prior history: From Dr. Clayburn June 2022  GI office note: I last saw Kristin Ramsey for a screening colonoscopy in February 2021, at which time she had 3 polyps, 1 each SSP, TA, hyperplastic. We received a message from her PCP recently asking for review and recommendations after a CT scan was done for abdominal pain and suggested possible polyp in the left colon.   Kristin Ramsey says she has had IBS with constipation for years, and she has been taking MiraLAX daily for about 6 months.  However, this gives her semiformed to loose stool a couple of times a day.  About 2 months ago she had a change where she noticed intermittent crampy lower abdominal pain and visible abdominal distention.  When the pain and distention occur, she does not get nausea vomiting, there is been no rectal bleeding.  Her mother died several months ago, and she has been traveling back and forth a lot to [place]  to help care for her father, and this is also led to a change in her work, which she takes with her and does somewhat remotely.  Colonoscopy July 2022 with a good preparation and a normal exam other than finding of markedly redundant colon (16-minute insertion time), the new finding from the 2021 exam.  At that time, she reported some relief of constipation on Linzess  72 mcg daily    Discussed the use of AI scribe software for clinical note transcription with the patient, who gave verbal consent to proceed.  History of Present Illness   The  patient, with a history of hiatal hernia, presents with a recent change in bowel habits and difficulty swallowing certain foods, particularly bread and sandwiches. They report a sensation of food getting stuck in the upper part of the chest, but deny any associated heartburn, nausea, or vomiting. The patient's appetite remains good and they are able to consume regular-sized meals, albeit slowly, which they attribute to personal preference rather than difficulty.  In addition to the swallowing issue, the patient has been experiencing episodic upper abdominal pain, slightly to the left of the midline, for the past few months.  The pain is described as a cramping sensation, often accompanied by bloating and a feeling of being 'gassy.' The patient has noticed that certain foods, such as salads, seem to exacerbate these symptoms, and as such, has eliminated them from their diet.  With that, she has not had recurrence of that pain in the last couple of weeks.  They also report a history of intolerance to fructose and sorbitol, and avoid consuming fruit due to associated stomachaches. Instead, they have been taking a natural supplement called 'Balance of [place].'  The patient also mentions a history of a hiatal hernia, diagnosed over a decade ago. They have been managing potential reflux symptoms with Prilosec in the evenings. The patient has made dietary changes, including increased water intake and reduced soda consumption, in an attempt to improve their overall health.  She also wonders if the upper abdominal pain could be muscular  in nature since it is less prominent lately as she has engaged in less exercise.  Lastly, she reports that Linzess  eventually stopped working well for her and also seems to cause side effect of increasing abdominal pain and bloating.  However, since discontinuing it she feels like her bowel habits have regulated and she does not feel the need for any particular constipation treatment at  this point.  ROS:  Review of Systems  Constitutional:  Negative for appetite change and unexpected weight change.  HENT:  Negative for mouth sores and voice change.   Eyes:  Negative for pain and redness.  Respiratory:  Negative for cough and shortness of breath.   Cardiovascular:  Negative for chest pain and palpitations.  Genitourinary:  Negative for dysuria and hematuria.  Musculoskeletal:  Positive for arthralgias. Negative for myalgias.  Skin:  Negative for pallor and rash.  Neurological:  Negative for weakness and headaches.  Hematological:  Negative for adenopathy.     Past Medical History: Past Medical History:  Diagnosis Date   Anxiety    Arthritis    GERD (gastroesophageal reflux disease)    Hypertension    Interstitial cystitis    Shingles      Past Surgical History: Past Surgical History:  Procedure Laterality Date   ABDOMINAL HYSTERECTOMY     COLONOSCOPY  2009   normal   HYSTEROSCOPY     LEFT HEART CATH AND CORONARY ANGIOGRAPHY N/A 11/25/2018   Procedure: LEFT HEART CATH AND CORONARY ANGIOGRAPHY;  Surgeon: Mady Bruckner, MD;  Location: [facility];  Service: Cardiovascular;  Laterality: N/A;   TONSILLECTOMY     UPPER GASTROINTESTINAL ENDOSCOPY  2009   hiatal hernia     Family History: Family History  Problem Relation Age of Onset   Stomach cancer Mother    Crohn's disease Mother    Brain cancer Mother    Hypertension Father    Anxiety disorder Father    Macular degeneration Father    Prostate cancer Father    Colon polyps Father    Colon cancer Neg Hx    Esophageal cancer Neg Hx    Rectal cancer Neg Hx     Social History: Social History   Socioeconomic History   Marital status: Married    Spouse name: Not on file   Number of children: 1   Years of education: 16   Highest education level: Bachelor's degree (e.g., BA, AB, BS)  Occupational History   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   Right-handed.   0.5 cup of coffee per day, occasional soda.   Social Drivers of Corporate Investment Banker Strain: Not on file  Food Insecurity: No Food Insecurity (07/22/2020)   Hunger Vital Sign    Worried About Running Out of Food in the Last Year: Never true    Ran Out of Food in the Last Year: Never true  Transportation Needs: No Transportation Needs (07/22/2020)   PRAPARE - Administrator, Civil Service (Medical): No  Lack of Transportation (Non-Medical): No  Physical Activity: Not on file  Stress: Not on file  Social Connections: Not on file    Allergies: Allergies  Allergen Reactions   Erythromycin Nausea And Vomiting and Other (See Comments)    Abdominal pain.   Latex Itching   Nitrous Oxide Nausea And Vomiting   Other Nausea Only    Anesthesia   Sulfamethoxazole-Trimethoprim Other (See Comments)    Fatigue/depression (lack of physical and emotional energy)    Outpatient Meds: Current Outpatient Medications  Medication Sig Dispense Refill   escitalopram (LEXAPRO) 10 MG tablet Take 10 mg by mouth every evening.      estradiol (VIVELLE-DOT) 0.05 MG/24HR patch Place 1 patch onto the skin 2 (two) times a week.     fexofenadine (ALLEGRA) 180 MG tablet Take 180 mg by mouth daily.     fluticasone (FLONASE) 50 MCG/ACT nasal spray Place 1 spray into both nostrils daily.     hydrochlorothiazide (HYDRODIURIL) 12.5 MG tablet Take 12.5 mg by mouth daily.   2   lactase (LACTAID) 3000 units tablet Take 3,000 Units by mouth 3 (three) times daily as needed (prior to dairy consumption).     LUMIFY 0.025 % SOLN Place 1 drop into  both eyes daily.     omeprazole (PRILOSEC OTC) 20 MG tablet Take 20 mg by mouth at bedtime.     OXcarbazepine  ER (OXTELLAR XR ) 300 MG TB24 TAKE 1 TABLET BY MOUTH EVERYDAY AT BEDTIME 90 tablet 4   pregabalin  (LYRICA ) 100 MG capsule TAKE 1 CAPSULE (100 MG TOTAL) BY MOUTH THREE TIMES DAILY. 90 capsule 5   SUMAtriptan (IMITREX) 100 MG tablet Take 100 mg by mouth as needed. (Patient not taking: Reported on 04/27/2023)     No current facility-administered medications for this visit.      ___________________________________________________________________ Objective   Exam:  BP 122/76   Pulse (!) 58   Ht 5' 7 (1.702 m)   Wt 163 lb 8 oz (74.2 kg)   SpO2 98%   BMI 25.61 kg/m  Wt Readings from Last 3 Encounters:  04/27/23 163 lb 8 oz (74.2 kg)  01/13/23 167 lb 1.7 oz (75.8 kg)  01/07/22 167 lb (75.8 kg)    General: Well-appearing Eyes: sclera anicteric, no redness ENT: oral mucosa moist without lesions, no cervical or supraclavicular lymphadenopathy CV: Regular without appreciable murmur, no JVD, no peripheral edema Resp: clear to auscultation bilaterally, normal RR and effort noted GI: soft, mild epigastric tenderness, with active bowel sounds. No guarding or palpable organomegaly noted. Skin; warm and dry, no rash or jaundice noted Neuro: awake, alert and oriented x 3. Normal gross motor function and fluent speech  No recent data for review  2014 abdominal ultrasound without gallstones 2014 HIDA scan showed nonspecific delay of tracer passage from liver to gallbladder (reports on file)  Encounter Diagnoses  Name Primary?   Esophageal dysphagia Yes   Epigastric pain     Assessment and Plan    Dysphagia Difficulty swallowing, particularly with bulky foods such as bread. No associated heartburn, nausea, or vomiting. Appetite and meal size are normal. Possible esophageal motility issue or reflux despite lack of regurgitation and heartburn.  She is on a PPI daily, which could be  masking some typical reflux symptoms. -Schedule endoscopy to investigate cause of dysphagia and perform endoscopic dilation if needed.  Epigastric Pain Episodic pain in the upper mid abdomen, slightly to the left, associated with bloating and gas. Pain has been more frequent in  the past 2-3 weeks. Possible muscular pain or related to dietary intake.  It is hard for her to be certain, but thinks these may be similar symptoms to what were happening when she had ultrasound and HIDA scan testing in 2014.  Even if so, they had a recurrence lately.  She would prefer to hold off on any gallbladder imaging at this point, at least until results of endoscopy are known. Also possible this may be muscular rather than digestive.  -Consider use of topical treatments such as lidocaine  patch, heat patches, or anti-inflammatory ointments for pain relief. -Revisit issue after results of endoscopy.  Chronic Constipation Previous use of Linzess , which was initially effective but stopped working after approximately eight months. Currently, bowel movements are more regular without major issues. -No immediate intervention needed, continue monitoring bowel habits.  Dietary Intolerances Reported intolerance to salads, fruits (due to fructose and sorbitol intolerance), and possibly dairy. Currently supplementing with Balance of Nature for fruit intake. -Continue dietary modifications as tolerated.    Thank you for the courtesy of this consult.  Please call me with any questions or concerns.  Victory LITTIE Brand III  CC: Referring provider noted above

## 2023-04-27 NOTE — Patient Instructions (Addendum)
 You have been scheduled for an endoscopy. Please follow written instructions given to you at your visit today.  If you use inhalers (even only as needed), please bring them with you on the day of your procedure.  If you take any of the following medications, they will need to be adjusted prior to your procedure:   DO NOT TAKE 7 DAYS PRIOR TO TEST- Trulicity (dulaglutide) Ozempic, Wegovy (semaglutide) Mounjaro (tirzepatide) Bydureon Bcise (exanatide extended release)  DO NOT TAKE 1 DAY PRIOR TO YOUR TEST Rybelsus (semaglutide) Adlyxin (lixisenatide) Victoza (liraglutide) Byetta (exanatide) ___________________________________________________________________________  _______________________________________________________  If your blood pressure at your visit was 140/90 or greater, please contact your primary care physician to follow up on this.  _______________________________________________________  If you are age 31 or older, your body mass index should be between 23-30. Your Body mass index is 25.61 kg/m. If this is out of the aforementioned range listed, please consider follow up with your Primary Care Provider.  If you are age 2 or younger, your body mass index should be between 19-25. Your Body mass index is 25.61 kg/m. If this is out of the aformentioned range listed, please consider follow up with your Primary Care Provider.   ________________________________________________________  The [facility] providers would like to encourage you to use MYCHART to communicate with providers for non-urgent requests or questions.  Due to long hold times on the telephone, sending your provider a message by [facility] may be a faster and more efficient way to get a response.  Please allow 48 business hours for a response.  Please remember that this is for non-urgent requests.  _______________________________________________________ It was a pleasure to see you today!  Thank you for  trusting me with your gastrointestinal care!

## 2023-06-01 ENCOUNTER — Encounter: Payer: Self-pay | Admitting: Gastroenterology

## 2023-06-10 ENCOUNTER — Encounter: Payer: Self-pay | Admitting: Gastroenterology

## 2023-06-10 ENCOUNTER — Ambulatory Visit (AMBULATORY_SURGERY_CENTER): Payer: BC Managed Care – PPO | Admitting: Gastroenterology

## 2023-06-10 VITALS — BP 138/86 | HR 74 | Temp 97.5°F | Resp 18 | Ht 67.0 in | Wt 163.0 lb

## 2023-06-10 DIAGNOSIS — K317 Polyp of stomach and duodenum: Secondary | ICD-10-CM | POA: Diagnosis not present

## 2023-06-10 DIAGNOSIS — R1310 Dysphagia, unspecified: Secondary | ICD-10-CM | POA: Diagnosis not present

## 2023-06-10 DIAGNOSIS — R1319 Other dysphagia: Secondary | ICD-10-CM

## 2023-06-10 DIAGNOSIS — R1013 Epigastric pain: Secondary | ICD-10-CM

## 2023-06-10 MED ORDER — SODIUM CHLORIDE 0.9 % IV SOLN: 500.0000 mL | INTRAVENOUS | Status: DC

## 2023-06-10 NOTE — Progress Notes (Signed)
 History and Physical:  This patient presents for endoscopic testing for: Encounter Diagnoses  Name Primary?   Esophageal dysphagia Yes   Epigastric pain     Clinical details in 04/27/23 office consult note.  Dysphagia and epigastric to LUQ pain  Patient is otherwise without complaints or active issues today.   Past Medical History: Past Medical History:  Diagnosis Date   Anxiety    Arthritis    GERD (gastroesophageal reflux disease)    Hypertension    Interstitial cystitis    Shingles      Past Surgical History: Past Surgical History:  Procedure Laterality Date   ABDOMINAL HYSTERECTOMY     COLONOSCOPY  2009   normal   HYSTEROSCOPY     LEFT HEART CATH AND CORONARY ANGIOGRAPHY N/A 11/25/2018   Procedure: LEFT HEART CATH AND CORONARY ANGIOGRAPHY;  Surgeon: Yvonne Kendall, MD;  Location: [facility];  Service: Cardiovascular;  Laterality: N/A;   TONSILLECTOMY     UPPER GASTROINTESTINAL ENDOSCOPY  2009   hiatal hernia    Allergies: Allergies  Allergen Reactions   Erythromycin Nausea And Vomiting and Other (See Comments)    Abdominal pain.   Latex Itching   Nitrous Oxide Nausea And Vomiting   Other Nausea Only    Anesthesia   Sulfamethoxazole-Trimethoprim Other (See Comments)    Fatigue/depression (lack of physical and emotional energy)    Outpatient Meds: Current Outpatient Medications  Medication Sig Dispense Refill   escitalopram (LEXAPRO) 10 MG tablet Take 10 mg by mouth every evening.      estradiol (VIVELLE-DOT) 0.05 MG/24HR patch Place 1 patch onto the skin 2 (two) times a week.     fluticasone (FLONASE) 50 MCG/ACT nasal spray Place 1 spray into both nostrils daily.     hydrochlorothiazide (HYDRODIURIL) 12.5 MG tablet Take 12.5 mg by mouth daily.   2   lactase (LACTAID) 3000 units tablet Take 3,000 Units by mouth 3 (three) times daily as needed (prior to dairy consumption).     LUMIFY 0.025 % SOLN Place 1 drop into both eyes daily.     omeprazole  (PRILOSEC OTC) 20 MG tablet Take 20 mg by mouth at bedtime.     OXcarbazepine ER (OXTELLAR XR) 300 MG TB24 TAKE 1 TABLET BY MOUTH EVERYDAY AT BEDTIME 90 tablet 4   pregabalin (LYRICA) 100 MG capsule TAKE 1 CAPSULE (100 MG TOTAL) BY MOUTH THREE TIMES DAILY. 90 capsule 5   fexofenadine (ALLEGRA) 180 MG tablet Take 180 mg by mouth daily.     SUMAtriptan (IMITREX) 100 MG tablet Take 100 mg by mouth as needed. (Patient not taking: Reported on 06/10/2023)     Current Facility-Administered Medications  Medication Dose Route Frequency Provider Last Rate Last Admin   0.9 %  sodium chloride infusion  500 mL Intravenous Continuous Danis, Starr Lake III, MD          ___________________________________________________________________ Objective   Exam:  BP (!) 142/98   Pulse 72   Temp (!) 97.5 F (36.4 C) (Temporal)   Ht 5\' 7"  (1.702 m)   Wt 163 lb (73.9 kg)   SpO2 97%   BMI 25.53 kg/m   CV: regular , S1/S2 Resp: clear to auscultation bilaterally, normal RR and effort noted GI: soft, no tenderness, with active bowel sounds.   Assessment: Encounter Diagnoses  Name Primary?   Esophageal dysphagia Yes   Epigastric pain      Plan:  EGD with possible dilation  The benefits and risks of the  planned procedure were described in detail with the patient or (when appropriate) their health care proxy.  Risks were outlined as including, but not limited to, bleeding, infection, perforation, adverse medication reaction leading to cardiac or pulmonary decompensation, pancreatitis (if ERCP).  The limitation of incomplete mucosal visualization was also discussed.  No guarantees or warranties were given.  The patient is appropriate for an endoscopic procedure in the ambulatory setting.   - Amada Jupiter, MD

## 2023-06-10 NOTE — Patient Instructions (Signed)
  Resume previous diet  Continue present medications  Office will call you to schedule your barium swallow test.   YOU HAD AN ENDOSCOPIC PROCEDURE TODAY AT THE  ENDOSCOPY CENTER:   Refer to the procedure report that was given to you for any specific questions about what was found during the examination.  If the procedure report does not answer your questions, please call your gastroenterologist to clarify.  If you requested that your care partner not be given the details of your procedure findings, then the procedure report has been included in a sealed envelope for you to review at your convenience later.  YOU SHOULD EXPECT: Some feelings of bloating in the abdomen. Passage of more gas than usual.  Walking can help get rid of the air that was put into your GI tract during the procedure and reduce the bloating. If you had a lower endoscopy (such as a colonoscopy or flexible sigmoidoscopy) you may notice spotting of blood in your stool or on the toilet paper. If you underwent a bowel prep for your procedure, you may not have a normal bowel movement for a few days.  Please Note:  You might notice some irritation and congestion in your nose or some drainage.  This is from the oxygen used during your procedure.  There is no need for concern and it should clear up in a day or so.  SYMPTOMS TO REPORT IMMEDIATELY:  Following upper endoscopy (EGD)  Vomiting of blood or coffee ground material  New chest pain or pain under the shoulder blades  Painful or persistently difficult swallowing  New shortness of breath  Fever of 100F or higher  Black, tarry-looking stools  For urgent or emergent issues, a gastroenterologist can be reached at any hour by calling [phone]. Do not use MyChart messaging for urgent concerns.    DIET:  We do recommend a small meal at first, but then you may proceed to your regular diet.  Drink plenty of fluids but you should avoid alcoholic beverages for 24  hours.  ACTIVITY:  You should plan to take it easy for the rest of today and you should NOT DRIVE or use heavy machinery until tomorrow (because of the sedation medicines used during the test).    FOLLOW UP: Our staff will call the number listed on your records the next business day following your procedure.  We will call around 7:15- 8:00 am to check on you and address any questions or concerns that you may have regarding the information given to you following your procedure. If we do not reach you, we will leave a message.     If any biopsies were taken you will be contacted by phone or by letter within the next 1-3 weeks.  Please call us at [phone] if you have not heard about the biopsies in 3 weeks.    SIGNATURES/CONFIDENTIALITY: You and/or your care partner have signed paperwork which will be entered into your electronic medical record.  These signatures attest to the fact that that the information above on your After Visit Summary has been reviewed and is understood.  Full responsibility of the confidentiality of this discharge information lies with you and/or your care-partner.

## 2023-06-10 NOTE — Progress Notes (Signed)
 Sedate, gd SR, tolerated procedure well, VSS, report to RN

## 2023-06-10 NOTE — Op Note (Signed)
 [facility] Patient Name: Kristin Ramsey Procedure Date: 06/10/2023 9:38 AM MRN: [id number] Endoscopist: Sherilyn Cooter L. Myrtie Neither , MD, [id number] Age: 63 Referring MD:  Date of Birth: [birth year] Gender: Female Account #: [id number] Procedure:                Upper GI endoscopy Indications:              Abdominal pain in the left upper quadrant,                            Esophageal dysphagia                           clinical details in recent office consult note Medicines:                Monitored Anesthesia Care Procedure:                Pre-Anesthesia Assessment:                           - Prior to the procedure, a History and Physical                            was performed, and patient medications and                            allergies were reviewed. The patient's tolerance of                            previous anesthesia was also reviewed. The risks                            and benefits of the procedure and the sedation                            options and risks were discussed with the patient.                            All questions were answered, and informed consent                            was obtained. Prior Anticoagulants: The patient has                            taken no anticoagulant or antiplatelet agents. ASA                            Grade Assessment: II - A patient with mild systemic                            disease. After reviewing the risks and benefits,                            the patient was deemed in satisfactory condition to  undergo the procedure.                           After obtaining informed consent, the endoscope was                            passed under direct vision. Throughout the                            procedure, the patient's blood pressure, pulse, and                            oxygen saturations were monitored continuously. The                            Olympus Scope SN [id number] was introduced  through the                            mouth, and advanced to the second part of duodenum.                            The upper GI endoscopy was accomplished without                            difficulty. The patient tolerated the procedure                            well (hiccups while under sedation). Scope In: Scope Out: Findings:                 The larynx was normal.                           The esophagus was normal. No esophagitis or other                            mucosal abnormality, dilatation, hiatal hernia,                            Barrett's or stricture.                           Multiple sessile fundic gland polyps were found in                            the gastric fundus and in the gastric body.                           The exam of the stomach was otherwise normal,                            including on retroflexion. (Hill grade 2) Distended                            normally with insufflation.  The examined duodenum was normal. Complications:            No immediate complications. Estimated Blood Loss:     Estimated blood loss: none. Impression:               - Normal larynx.                           - Normal esophagus.                           - Multiple fundic gland polyps.                           - Normal examined duodenum.                           - No specimens collected. Recommendation:           - Patient has a contact number available for                            emergencies. The signs and symptoms of potential                            delayed complications were discussed with the                            patient. Return to normal activities tomorrow.                            Written discharge instructions were provided to the                            patient.                           - Resume previous diet.                           - Continue present medications.                           - Perform a barium  swallow using barium in liquid                            and tablet form at appointment to be scheduled.                            Suspected nonspecific esophageal dysmotility. Katherene Dinino L. Myrtie Neither, MD 06/10/2023 10:02:29 AM This report has been signed electronically.

## 2023-06-11 ENCOUNTER — Telehealth: Payer: Self-pay

## 2023-06-11 NOTE — Telephone Encounter (Signed)
  Follow up Call-     06/10/2023    9:23 AM 10/30/2020    1:07 PM  Call back number  Post procedure Call Back phone  # [phone]  Permission to leave phone message Yes Yes    Attempted to call regarding follow-up, no answer. VM left.

## 2023-06-17 ENCOUNTER — Other Ambulatory Visit: Payer: Self-pay

## 2023-06-17 ENCOUNTER — Telehealth: Payer: Self-pay

## 2023-06-17 DIAGNOSIS — R1319 Other dysphagia: Secondary | ICD-10-CM

## 2023-06-17 DIAGNOSIS — R1013 Epigastric pain: Secondary | ICD-10-CM

## 2023-06-17 NOTE — Telephone Encounter (Signed)
 Called patient to get scheduled for barium swallow , says she agrees to do barium swallow will have to wait to schedule it, I am going to go ahead and send the order in and let scheduling know she will give them a call.

## 2023-07-20 ENCOUNTER — Other Ambulatory Visit: Payer: Self-pay | Admitting: Neurology

## 2023-07-21 NOTE — Telephone Encounter (Signed)
 Last seen 01/13/23, next appt 01/18/24 Dispenses   Dispensed Days Supply Quantity Provider Pharmacy  PREGABALIN 100 MG CAPSULE 06/21/2023 30 90 each Glean Salvo, NP [facility] [phone] - [facility]...  PREGABALIN 100 MG CAPSULE 05/21/2023 30 90 each Glean Salvo, NP [facility] [phone] - [facility]...  PREGABALIN 100 MG CAPSULE 04/22/2023 30 90 each Glean Salvo, NP [facility] [phone] - J...  PREGABALIN 100 MG CAPSULE 03/21/2023 30 90 each Glean Salvo, NP [facility] [phone] - J...  PREGABALIN 100 MG CAPSULE 02/20/2023 30 90 each Glean Salvo, NP [facility] [phone] - [facility]...  PREGABALIN 100 MG CAPSULE 01/21/2023 30 90 each Glean Salvo, NP [facility] [phone] - J...  PREGABALIN 100 MG CAPSULE 12/27/2022 20 60 each Glean Salvo, NP [facility] [phone] - J...  PREGABALIN 100 MG CAPSULE 12/08/2022 20 60 each Glean Salvo, NP [facility] [phone] - J...  PREGABALIN 100 MG CAPSULE 11/07/2022 30 90 each Glean Salvo, NP [facility] [phone] - J...  PREGABALIN 100 MG CAPSULE 10/09/2022 30 90 each Glean Salvo, NP [facility] [phone] - J...  PREGABALIN 100 MG CAPSULE 09/10/2022 30 90 each Glean Salvo, NP [facility] [phone] - J...  PREGABALIN 100 MG CAPSULE 08/11/2022 30 90 each Glean Salvo, NP [facility] [phone] - J.Marland KitchenMarland Kitchen

## 2023-08-18 DIAGNOSIS — Z1151 Encounter for screening for human papillomavirus (HPV): Secondary | ICD-10-CM | POA: Diagnosis not present

## 2023-08-18 DIAGNOSIS — Z01419 Encounter for gynecological examination (general) (routine) without abnormal findings: Secondary | ICD-10-CM | POA: Diagnosis not present

## 2023-08-18 DIAGNOSIS — R87811 Vaginal high risk human papillomavirus (HPV) DNA test positive: Secondary | ICD-10-CM | POA: Diagnosis not present

## 2023-08-18 DIAGNOSIS — Z1231 Encounter for screening mammogram for malignant neoplasm of breast: Secondary | ICD-10-CM | POA: Diagnosis not present

## 2023-08-18 DIAGNOSIS — Z6826 Body mass index (BMI) 26.0-26.9, adult: Secondary | ICD-10-CM | POA: Diagnosis not present

## 2023-08-24 ENCOUNTER — Other Ambulatory Visit: Payer: Self-pay | Admitting: Obstetrics and Gynecology

## 2023-08-24 DIAGNOSIS — R928 Other abnormal and inconclusive findings on diagnostic imaging of breast: Secondary | ICD-10-CM

## 2023-08-30 ENCOUNTER — Encounter (HOSPITAL_COMMUNITY): Payer: Self-pay

## 2023-08-30 DIAGNOSIS — E785 Hyperlipidemia, unspecified: Secondary | ICD-10-CM | POA: Diagnosis not present

## 2023-08-30 DIAGNOSIS — Z0000 Encounter for general adult medical examination without abnormal findings: Secondary | ICD-10-CM | POA: Diagnosis not present

## 2023-08-30 DIAGNOSIS — F3341 Major depressive disorder, recurrent, in partial remission: Secondary | ICD-10-CM | POA: Diagnosis not present

## 2023-08-30 DIAGNOSIS — I10 Essential (primary) hypertension: Secondary | ICD-10-CM | POA: Diagnosis not present

## 2023-09-05 DIAGNOSIS — J019 Acute sinusitis, unspecified: Secondary | ICD-10-CM | POA: Diagnosis not present

## 2023-09-05 DIAGNOSIS — R051 Acute cough: Secondary | ICD-10-CM | POA: Diagnosis not present

## 2023-09-06 ENCOUNTER — Ambulatory Visit
Admission: RE | Admit: 2023-09-06 | Discharge: 2023-09-06 | Disposition: A | Discharge status: Home / Self Care | Source: Ambulatory Visit | Attending: Obstetrics and Gynecology | Admitting: Obstetrics and Gynecology

## 2023-09-06 ENCOUNTER — Other Ambulatory Visit: Payer: Self-pay | Admitting: Obstetrics and Gynecology

## 2023-09-06 DIAGNOSIS — R928 Other abnormal and inconclusive findings on diagnostic imaging of breast: Secondary | ICD-10-CM

## 2023-09-06 DIAGNOSIS — N6489 Other specified disorders of breast: Secondary | ICD-10-CM

## 2023-11-02 DIAGNOSIS — Z1283 Encounter for screening for malignant neoplasm of skin: Secondary | ICD-10-CM | POA: Diagnosis not present

## 2023-11-02 DIAGNOSIS — D225 Melanocytic nevi of trunk: Secondary | ICD-10-CM | POA: Diagnosis not present

## 2023-11-02 DIAGNOSIS — L738 Other specified follicular disorders: Secondary | ICD-10-CM | POA: Diagnosis not present

## 2023-11-09 ENCOUNTER — Other Ambulatory Visit: Payer: Self-pay | Admitting: Neurology

## 2023-11-09 MED ORDER — OXCARBAZEPINE ER 300 MG PO TB24: 300.0000 mg | ORAL_TABLET | Freq: Every day | ORAL | 3 refills | Status: DC

## 2023-11-09 NOTE — Telephone Encounter (Signed)
 I called the patient, apparently lately getting brand name Oxtellar XR , okay to switch back to generic. She has been on generic in the past and has done fine.   Meds ordered this encounter  Medications   DISCONTD: OXcarbazepine  ER 300 MG TB24    Sig: Take 1 tablet (300 mg total) by mouth at bedtime.    Dispense:  90 tablet    Refill:  3   OXcarbazepine  ER (OXTELLAR XR ) 300 MG TB24    Sig: Take 1 tablet (300 mg total) by mouth at bedtime.    Dispense:  90 tablet    Refill:  3    Generic is fine.

## 2023-11-09 NOTE — Telephone Encounter (Signed)
 Pt called to informed that when PT went to Pharmacy and was told the medication will be 700 dollars. Pt stated she called insurance first  and Insurance told Pt to call MD  to see if Pt can get the generic version of medication. Pt stated she wanted to try the generic version . OXcarbazepine  ER (OXTELLAR XR ) 300 MG TB24  Pt would like medication to  go to :  [id number] - [place], [place] - [address] (Ph: [phone])

## 2024-01-13 ENCOUNTER — Ambulatory Visit: Payer: No Typology Code available for payment source | Admitting: Neurology

## 2024-01-18 ENCOUNTER — Encounter: Payer: Self-pay | Admitting: Neurology

## 2024-01-18 ENCOUNTER — Ambulatory Visit: Payer: No Typology Code available for payment source | Admitting: Neurology

## 2024-01-18 VITALS — BP 126/88 | HR 65 | Ht 67.0 in | Wt 159.6 lb

## 2024-01-18 DIAGNOSIS — G43709 Chronic migraine without aura, not intractable, without status migrainosus: Secondary | ICD-10-CM

## 2024-01-18 DIAGNOSIS — B0229 Other postherpetic nervous system involvement: Secondary | ICD-10-CM | POA: Diagnosis not present

## 2024-01-18 NOTE — Progress Notes (Signed)
 Patient: Kristin Ramsey Date of Birth: [birth year]  Reason for Visit: Follow up History from: Patient Primary Neurologist: Onita   ASSESSMENT AND PLAN 63 y.o. year old female   1.  Shingles involving right V1, mild involvement of right V2 in December 2018 2.  Prolonged postherpetic neuralgia 3.  Chronic migraine headaches -Stable doing well, may try to reduce Oxtellar-XR down to 150 mg in the future, wants to finish out her 300's and message me when ready, will check CMP today, in the past try to reduce Lyrica  down to twice daily with worsening facial pain.  -Continue Lyrica  100 mg up to 3 times daily -Continue Oxtellar-XR 300 mg at bedtime, will message when ready to go down to 150's, also send picture of bottle, is she getting generic or brand? I am okay with either, depending on cost  -Migraines doing great, continue Imitrex 100 mg as needed -Follow up in 1 year or sooner if needed, 15 min VV  HISTORY  Kristin Ramsey is a 63 year old female, accompanied by her husband, seen in refer by  Aisha Harvey, for evaluation of postherpetic neuralgia, initial evaluation was on May 10, 2017.   I reviewed and summarized the referring note, she had a past medical history of major depression, hiatal hernia, she did reported a history of chickenpox in the past,   She has history of chronic migraine, on April 09, 2017, she developed right forehead retro-orbital area headaches, she thought it was her usual migraine headaches, but she had persistent severe pain, went to the emergency room on April 10, 2017, cocktail treatment did not help her headache.   On April 11, 2017, she noticed blisters breaking out at the right forehead region, also noticed right eye light sensitivity, tearing, right upper eyelid swollen, presented to the emergency room on April 13, 2017, was diagnosis with herpes, was given valacyclovir  1 g 3 times a day for 2 weeks,   She was seen by Dr. Eyvonne on April 16, 2017, later in January 2019, she was found to have right corneal involvement, received eyedrops ofloxacin  0.3%, Keflex 500 mg twice a day for 10 days,   Now she has persistent right eye sensitivity, tearing, was not able to go back to work, also right forehead region itching, burning, hypersensitivity,   She only took prescribed gabapentin  300 mg twice, does help her sleep, but complains of drowsiness, she also complains of worsening depression anxiety.   UPDATE Jun 10 2017: She has never tried Trileptal  150 mg twice a day as prescribed, worried about the side effect, overall her symptoms has improved, she is no longer has constant right eye tearing, but still has right eye foreign object sensation, no loss of vision, was seen by her ophthalmologist recently, was told she is healing well, she continue has mild right upper eyelid swelling, right forehead skin discoloration, swelling, she is taking gabapentin  300 mg up to 4-5 tablets each day, right forehead region neuropathic pain, complains of fatigue, drowsiness, on short-term disability.  Also taking clonazepam  0.5 mg every night to help the itching and sleep,   UPDATE Sep 04 2019: She is not taking gabapentin  300 mg 6 tablets a day, Oxtellar 300 mg every night, continue complains of significant sensitivity at the right V1 distribution, exacerbated by touching, washing her hair, excessive sun exposure, right eye sensitivity has much improved, there was no significant right visual loss, she is continue follow-up by her ophthalmologist   Laboratory evaluation in July 2020  showed normal BMP, sodium of 138, CBC, hemoglobin of 12.8.   UPDATE Mar 26 2020: She is very happy with current combination, Lyrica  100 mg 3 times a day, she has decreased Oxtellar to 300 mg at bedtime, she tolerated the medication well, no apparent side effect, occasional weakness in the right V1 territory   UPDATE Feb 6th 2023: She continues to have right V2, V1 area skin  sensitivity, continue with Oxtellar 300 mg every night, it does help her sleep, she could not tolerate sleeping on her right side, complains of right side skin sensitivity,   She had long history of migraine headaches, right lateralized severe pounding headache with light noise sensitivity, nauseous, lasting couple hours or longer, has not had recurrent for a while  She went through very stressful 2022, she lost her mother, just moved her father to assisted living, since January 2023, she began to have intermittent migraine headache, had 2 major prolonged migraine, Imitrex 100 mg as needed provided help most of the time, 32, [place] so even longer for us  to take effective, sleep always helpful, she also complains tension in her right neck,  Update January 07, 2022 SS: Only significant migraine is with weather change, once a month, takes few ibuprofen and it improves. Usually right sided. She is pleased. Facial pain is doing great, was able to go to the beach for 1 week, has been unbearable in the past with heat and sun. Still has some sensitivity, still sensitive when fixing hair to dry or curl. Still taking Lyrica  100 mg 3 times daily, Oxtellar XR  300 mg daily. Takes tizanidine  PRN. Has not needed the Imitrex, has never taken the Maxalt . Claims energy is more, no significant snoring. PCP checks labs.  Stress level is better.  Update January 13, 2023 SS: Labs from PCP 01/05/2023, creatinine 0.82, sodium 139, AST 15, ALT 12. Doing much better, if weather change will feel tightness to right V1, when time to wash hair, area feels heavy. Remains on Lyrica  100 mg TID, Oxtellar XR  300 mg at bedtime. Has not had any migraines, only a few sinus related headaches. Walks nightly. Her daughter is now back at home.   Update January 18, 2024 SS: No migraines, has not needed the Imitrex. Remains on Lyrica  100 mg TID, can tell if she misses one, will get pressure/tightness/itchy to right frontal/temporal area.  Remains on Oxtellar XR  300 mg at bedtime, helps with nerve pain at night. Mentions a few times low BP, dizzy when she 1st stands, 95/70's, related to Oxtellar?   REVIEW OF SYSTEMS: Out of a complete 14 system review of symptoms, the patient complains only of the following symptoms, and all other reviewed systems are negative.  See HPI  ALLERGIES: Allergies  Allergen Reactions   Erythromycin Nausea And Vomiting and Other (See Comments)    Abdominal pain.   Latex Itching   Nitrous Oxide Nausea And Vomiting   Other Nausea Only    Anesthesia   Sulfamethoxazole-Trimethoprim Other (See Comments)    Fatigue/depression (lack of physical and emotional energy)    HOME MEDICATIONS: Outpatient Medications Prior to Visit  Medication Sig Dispense Refill   escitalopram (LEXAPRO) 10 MG tablet Take 10 mg by mouth every evening.      estradiol (VIVELLE-DOT) 0.05 MG/24HR patch Place 1 patch onto the skin 2 (two) times a week.     fexofenadine (ALLEGRA) 180 MG tablet Take 180 mg by mouth daily. (Patient taking differently: Take 180 mg by mouth daily. As needed)  fluticasone (FLONASE) 50 MCG/ACT nasal spray Place 1 spray into both nostrils daily.     lactase (LACTAID) 3000 units tablet Take 3,000 Units by mouth 3 (three) times daily as needed (prior to dairy consumption).     LUMIFY 0.025 % SOLN Place 1 drop into both eyes daily.     omeprazole (PRILOSEC OTC) 20 MG tablet Take 20 mg by mouth at bedtime.     OXcarbazepine  ER (OXTELLAR XR ) 300 MG TB24 TAKE 1 TABLET BY MOUTH EVERYDAY AT BEDTIME 90 tablet 4   pregabalin  (LYRICA ) 100 MG capsule TAKE 1 CAPSULE (100 MG TOTAL) BY MOUTH 3 TIMES A DAY 90 capsule 5   hydrochlorothiazide (HYDRODIURIL) 12.5 MG tablet Take 12.5 mg by mouth daily.  (Patient not taking: Reported on 01/18/2024)  2   OXcarbazepine  ER (OXTELLAR XR ) 300 MG TB24 Take 1 tablet (300 mg total) by mouth at bedtime. 90 tablet 3   SUMAtriptan (IMITREX) 100 MG tablet Take 100 mg by mouth as  needed. (Patient not taking: Reported on 01/18/2024)     No facility-administered medications prior to visit.    PAST MEDICAL HISTORY: Past Medical History:  Diagnosis Date   Anxiety    Arthritis    GERD (gastroesophageal reflux disease)    Hypertension    Interstitial cystitis    Shingles     PAST SURGICAL HISTORY: Past Surgical History:  Procedure Laterality Date   ABDOMINAL HYSTERECTOMY     COLONOSCOPY  2009   normal   HYSTEROSCOPY     LEFT HEART CATH AND CORONARY ANGIOGRAPHY N/A 11/25/2018   Procedure: LEFT HEART CATH AND CORONARY ANGIOGRAPHY;  Surgeon: Mady Bruckner, MD;  Location: [facility];  Service: Cardiovascular;  Laterality: N/A;   TONSILLECTOMY     UPPER GASTROINTESTINAL ENDOSCOPY  2009   hiatal hernia    FAMILY HISTORY: Family History  Problem Relation Age of Onset   Stomach cancer Mother    Crohn's disease Mother    Brain cancer Mother    Hypertension Father    Anxiety disorder Father    Macular degeneration Father    Prostate cancer Father    Colon polyps Father    Colon cancer Neg Hx    Esophageal cancer Neg Hx    Rectal cancer Neg Hx     SOCIAL HISTORY: Social History   Socioeconomic History   Marital status: Married    Spouse name: Not on file   Number of children: 1   Years of education: 16   Highest education level: Bachelor's degree (e.g., BA, AB, BS)  Occupational History   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   Right-handed.   0.5 cup of coffee per day, occasional soda.   Social Drivers of Corporate investment banker Strain: Not on file   Food Insecurity: No Food Insecurity (07/22/2020)   Hunger Vital Sign    Worried About Running Out of Food in the Last Year: Never true    Ran Out of Food in the Last Year: Never  true  Transportation Needs: No Transportation Needs (07/22/2020)   PRAPARE - Administrator, Civil Service (Medical): No    Lack of Transportation (Non-Medical): No  Physical Activity: Not on file  Stress: Not on file  Social Connections: Not on file  Intimate Partner Violence: Not on file   PHYSICAL EXAM  Vitals:   01/18/24 0752  BP: 126/88  Pulse: 65  Weight: 159 lb 9.6 oz (72.4 kg)  Height: 5' 7 (1.702 m)   Body mass index is 25 kg/m.  Generalized: Well developed, in no acute distress  Neurological examination  Mentation: Alert oriented to time, place, history taking. Follows all commands speech and language fluent Cranial nerve II-XII: Pupils were equal round reactive to light. Extraocular movements were full, visual field were full on confrontational test. Facial sensation and strength were normal. Head turning and shoulder shrug  were normal and symmetric. Motor: The motor testing reveals 5 over 5 strength of all 4 extremities. Good symmetric motor tone is noted throughout.  Sensory: Sensory testing is intact to soft touch on all 4 extremities. No evidence of extinction is noted.  Coordination: Cerebellar testing reveals good finger-nose-finger and heel-to-shin bilaterally.  Gait and station: Gait is normal.    DIAGNOSTIC DATA (LABS, IMAGING, TESTING) - I reviewed patient records, labs, notes, testing and imaging myself where available.  Lab Results  Component Value Date   WBC 6.0 09/04/2019   HGB 13.5 09/04/2019   HCT 40.5 09/04/2019   MCV 92 09/04/2019   PLT 210 11/02/2018      Component Value Date/Time   NA 139 09/04/2019 1348   K 5.2 09/04/2019 1348   CL 101 09/04/2019 1348   CO2 26 09/04/2019 1348   GLUCOSE 84 09/04/2019 1348   GLUCOSE 87 05/21/2009 1224   BUN 17  09/04/2019 1348   CREATININE 0.72 09/04/2019 1348   CALCIUM 9.3 09/04/2019 1348   PROT 6.8 09/04/2019 1348   ALBUMIN 4.3 09/04/2019 1348   AST 16 09/04/2019 1348   ALT 9 09/04/2019 1348   ALKPHOS 66 09/04/2019 1348   BILITOT 0.2 09/04/2019 1348   GFRNONAA 93 09/04/2019 1348   GFRAA 108 09/04/2019 1348   No results found for: CHOL, HDL, LDLCALC, LDLDIRECT, TRIG, CHOLHDL No results found for: YHAJ8R Lab Results  Component Value Date   VITAMINB12 290 05/21/2009   Lab Results  Component Value Date   TSH 1.490 09/04/2019   Lauraine Born, AGNP-C, DNP 01/18/2024, 8:13 AM [facility] [address] [place], [place] [address] [phone]

## 2024-01-18 NOTE — Patient Instructions (Signed)
 Great to see you today! Send me a picture of your medication bottle of the Oxtellar Plan to reduce down to 150 mg tablet when you are ready Check labs today Follow-up in 1 year virtually.  Reach out if needed sooner.  Thanks!!

## 2024-01-19 ENCOUNTER — Other Ambulatory Visit: Payer: Self-pay | Admitting: Neurology

## 2024-01-19 ENCOUNTER — Ambulatory Visit: Payer: Self-pay | Admitting: Neurology

## 2024-01-19 LAB — COMPREHENSIVE METABOLIC PANEL WITH GFR
ALT: 10 IU/L (ref 0–32)
AST: 15 IU/L (ref 0–40)
Albumin: 4.1 g/dL (ref 3.9–4.9)
Alkaline Phosphatase: 78 IU/L (ref 49–135)
BUN/Creatinine Ratio: 15 (ref 12–28)
BUN: 13 mg/dL (ref 8–27)
Bilirubin Total: 0.3 mg/dL (ref 0.0–1.2)
CO2: 25 mmol/L (ref 20–29)
Calcium: 8.8 mg/dL (ref 8.7–10.3)
Chloride: 104 mmol/L (ref 96–106)
Creatinine, Ser: 0.85 mg/dL (ref 0.57–1.00)
Globulin, Total: 2.5 g/dL (ref 1.5–4.5)
Glucose: 87 mg/dL (ref 70–99)
Potassium: 4.8 mmol/L (ref 3.5–5.2)
Sodium: 142 mmol/L (ref 134–144)
Total Protein: 6.6 g/dL (ref 6.0–8.5)
eGFR: 78 mL/min/1.73 (ref >59)

## 2024-01-19 NOTE — Telephone Encounter (Signed)
 Patient sent in my chart message. She is getting generic Oxtellar.

## 2024-01-20 ENCOUNTER — Other Ambulatory Visit: Payer: Self-pay | Admitting: Neurology

## 2024-01-20 MED ORDER — PREGABALIN 100 MG PO CAPS: ORAL_CAPSULE | ORAL | 5 refills | Status: AC

## 2024-01-20 NOTE — Addendum Note (Signed)
 Addended by: ONEITA NEVELYN BRAVO on: 01/20/2024 10:12 AM   Modules accepted: Orders

## 2024-01-20 NOTE — Telephone Encounter (Signed)
 Pt is asking to be called re: the refusal of refilling her medication, pt is down to 3, please call, pt states this has not been filled by any other office.

## 2024-01-20 NOTE — Telephone Encounter (Signed)
 Requested Prescriptions   Pending Prescriptions Disp Refills   pregabalin  (LYRICA ) 100 MG capsule 90 capsule 5    Sig: TAKE 1 CAPSULE (100 MG TOTAL) BY MOUTH 3 TIMES A DAY   Last seen 01/18/24 Next appt 01/17/25 Dispenses   Dispensed Days Supply Quantity Provider Pharmacy  PREGABALIN  100 MG CAPSULE 12/20/2023 30 90 each [place] Lauraine PARAS, NP [facility] [phone] - J...  PREGABALIN  100 MG CAPSULE 11/19/2023 30 90 each [place] Lauraine PARAS, NP [facility] [phone] - J...  PREGABALIN  100 MG CAPSULE 10/20/2023 30 90 each [place] Lauraine PARAS, NP [facility] [phone] - J...  PREGABALIN  100 MG CAPSULE 09/19/2023 30 90 each [place] Lauraine PARAS, NP [facility] [phone] - J...  PREGABALIN  100 MG CAPSULE 08/19/2023 30 90 each [place] Lauraine PARAS, NP [facility] [phone] - J...  PREGABALIN  100 MG CAPSULE 07/21/2023 30 90 each [place] Lauraine PARAS, NP [facility] [phone] - J...  PREGABALIN  100 MG CAPSULE 06/21/2023 30 90 each [place] Lauraine PARAS, NP [facility] [phone] - J...  PREGABALIN  100 MG CAPSULE 05/21/2023 30 90 each [place] Lauraine PARAS, NP [facility] [phone] - J...  PREGABALIN  100 MG CAPSULE 04/22/2023 30 90 each [place] Lauraine PARAS, NP [facility] [phone] - J...  PREGABALIN  100 MG CAPSULE 03/21/2023 30 90 each [place] Lauraine PARAS, NP [facility] [phone] - J...  PREGABALIN  100 MG CAPSULE 02/20/2023 30 90 each [place] Lauraine PARAS, NP [facility] [phone] - J.[email or web address]

## 2024-02-02 MED ORDER — OXCARBAZEPINE ER 150 MG PO TB24: ORAL_TABLET | ORAL | 3 refills | Status: AC

## 2024-02-02 NOTE — Addendum Note (Signed)
 Addended by: GAYLAND LAURAINE PARAS on: 02/02/2024 09:56 AM   Modules accepted: Orders

## 2024-02-21 ENCOUNTER — Encounter: Payer: Self-pay | Admitting: Podiatry

## 2024-02-21 ENCOUNTER — Ambulatory Visit: Admitting: Podiatry

## 2024-02-21 ENCOUNTER — Ambulatory Visit (INDEPENDENT_AMBULATORY_CARE_PROVIDER_SITE_OTHER)

## 2024-02-21 DIAGNOSIS — M21619 Bunion of unspecified foot: Secondary | ICD-10-CM

## 2024-02-21 DIAGNOSIS — M722 Plantar fascial fibromatosis: Secondary | ICD-10-CM

## 2024-02-21 DIAGNOSIS — M7752 Other enthesopathy of left foot: Secondary | ICD-10-CM

## 2024-02-21 MED ORDER — TRIAMCINOLONE ACETONIDE 10 MG/ML IJ SUSP
10.0000 mg | Freq: Once | INTRAMUSCULAR | Status: AC
  Administered 2024-02-21: 10 mg via INTRA_ARTICULAR

## 2024-02-23 NOTE — Progress Notes (Signed)
 Subjective:   Patient ID: Kristin Ramsey, female   DOB: 63 y.o.   MRN: [id number]   HPI Patient states she has had a lot of pain in the bottom of the  left foot and states that she has developed a lot of inflammation does not remember injury.  Has been treated for heel in the past and that seems to be improved and patient does not smoke likes to be active   Review of Systems  All other systems reviewed and are negative.       Objective:  Physical Exam Vitals and nursing note reviewed.  Constitutional:      Appearance: She is well-developed.  Pulmonary:     Effort: Pulmonary effort is normal.  Musculoskeletal:        General: Normal range of motion.  Skin:    General: Skin is warm.  Neurological:     Mental Status: She is alert.     Neurovascular status was found to be intact and muscle strength was found to be adequate range of motion adequate with acute discomfort second metatarsal phalangeal joint left with fluid buildup minimal discomfort currently in the plantar fascia no history of injury several months duration of condition.  Good digital perfusion well-oriented x 3 slight elevation of the second toe but not significant     Assessment:  Appears to be acute inflammatory capsulitis second MPJ left cannot rule out mild flexor plate deformity     Plan:  H&P condition reviewed x-ray discussed and at this point forefoot block administered I then under sterile conditions aspirated the second joint getting out a small amount of clear fluid injected with quarter cc dexamethasone Kenalog  into the joint applied padding to reduce pressure instructed rigid shoes and reappoint to recheck in the next several weeks.  Discussed possibility for flexor plate issues and I will evaluate that depending on response to treatment today  X-rays indicate that the joint does not appear to have fracture or arthritis with minimal elevation of the digit currently

## 2024-03-06 ENCOUNTER — Ambulatory Visit: Admitting: Podiatry

## 2024-03-06 DIAGNOSIS — M7752 Other enthesopathy of left foot: Secondary | ICD-10-CM | POA: Diagnosis not present

## 2024-03-08 ENCOUNTER — Ambulatory Visit

## 2024-03-08 ENCOUNTER — Ambulatory Visit
Admission: RE | Admit: 2024-03-08 | Discharge: 2024-03-08 | Disposition: A | Discharge status: Home / Self Care | Source: Ambulatory Visit | Attending: Obstetrics and Gynecology | Admitting: Obstetrics and Gynecology

## 2024-03-08 DIAGNOSIS — R92322 Mammographic fibroglandular density, left breast: Secondary | ICD-10-CM | POA: Diagnosis not present

## 2024-03-08 DIAGNOSIS — N6489 Other specified disorders of breast: Secondary | ICD-10-CM

## 2024-03-08 DIAGNOSIS — R928 Other abnormal and inconclusive findings on diagnostic imaging of breast: Secondary | ICD-10-CM | POA: Diagnosis not present

## 2024-03-08 NOTE — Progress Notes (Signed)
 Subjective:   Patient ID: Kristin Ramsey, female   DOB: 63 y.o.   MRN: [id number]   HPI Patient states feeling much better after having treatment of left heel   ROS      Objective:  Physical Exam  Neurovascular status intact muscle strength adequate with the patient's left heel doing much better minimal discomfort minimal swelling noted     Assessment:  Doing well post plantar fascial inflammation left with good recovery from previous treatment     Plan:  H&P reviewed recommended continued supportive therapy anti-inflammatories physical therapy and reappoint to recheck

## 2025-01-17 ENCOUNTER — Telehealth: Admitting: Neurology
